# Patient Record
Sex: Male | Born: 1935 | Race: White | Hispanic: No | Marital: Married | Smoking: Never smoker
Health system: Southern US, Community
[De-identification: ages and names within clinical notes are randomized; demographics above are authoritative.]

## PROBLEM LIST (undated history)

## (undated) DIAGNOSIS — E785 Hyperlipidemia, unspecified: Secondary | ICD-10-CM

## (undated) DIAGNOSIS — Z951 Presence of aortocoronary bypass graft: Secondary | ICD-10-CM

## (undated) DIAGNOSIS — I2510 Atherosclerotic heart disease of native coronary artery without angina pectoris: Secondary | ICD-10-CM

## (undated) DIAGNOSIS — C801 Malignant (primary) neoplasm, unspecified: Secondary | ICD-10-CM

## (undated) DIAGNOSIS — Z9049 Acquired absence of other specified parts of digestive tract: Secondary | ICD-10-CM

## (undated) HISTORY — PX: CHOLECYSTECTOMY: SHX55

## (undated) HISTORY — PX: CARDIAC SURGERY: SHX584

---

## 1985-12-09 DIAGNOSIS — Z951 Presence of aortocoronary bypass graft: Secondary | ICD-10-CM

## 1985-12-09 HISTORY — DX: Presence of aortocoronary bypass graft: Z95.1

## 1997-12-09 DIAGNOSIS — Z9049 Acquired absence of other specified parts of digestive tract: Secondary | ICD-10-CM

## 1997-12-09 HISTORY — DX: Acquired absence of other specified parts of digestive tract: Z90.49

## 2010-03-26 ENCOUNTER — Encounter: Payer: Self-pay | Admitting: Gastroenterology

## 2010-04-13 ENCOUNTER — Encounter: Payer: Self-pay | Admitting: Gastroenterology

## 2010-04-17 ENCOUNTER — Encounter: Payer: Self-pay | Admitting: Gastroenterology

## 2010-04-19 ENCOUNTER — Encounter: Payer: Self-pay | Admitting: Gastroenterology

## 2010-04-23 ENCOUNTER — Encounter: Payer: Self-pay | Admitting: Gastroenterology

## 2010-05-07 ENCOUNTER — Encounter: Payer: Self-pay | Admitting: Gastroenterology

## 2010-05-11 ENCOUNTER — Encounter: Payer: Self-pay | Admitting: Gastroenterology

## 2010-05-15 ENCOUNTER — Encounter (INDEPENDENT_AMBULATORY_CARE_PROVIDER_SITE_OTHER): Payer: Self-pay | Admitting: *Deleted

## 2010-05-15 ENCOUNTER — Telehealth (INDEPENDENT_AMBULATORY_CARE_PROVIDER_SITE_OTHER): Payer: Self-pay | Admitting: *Deleted

## 2010-05-15 DIAGNOSIS — R933 Abnormal findings on diagnostic imaging of other parts of digestive tract: Secondary | ICD-10-CM

## 2010-05-18 ENCOUNTER — Telehealth (INDEPENDENT_AMBULATORY_CARE_PROVIDER_SITE_OTHER): Payer: Self-pay | Admitting: *Deleted

## 2011-01-08 NOTE — Letter (Signed)
Summary: Med list/[facility]  Med list/[facility]   Imported By: Lester [place] 05/17/2010 10:14:56  _____________________________________________________________________  External Attachment:    Type:   Image     Comment:   External Document

## 2011-01-08 NOTE — Letter (Signed)
Summary: [facility]   Imported By: Lester [place] 05/17/2010 10:20:07  _____________________________________________________________________  External Attachment:    Type:   Image     Comment:   External Document

## 2011-01-08 NOTE — Letter (Signed)
Summary: EGD Instructions  [facility]  [address] [place], [place] [address]   Phone: [phone]  Fax: [phone]       Marc Adams    [birth year]    MRN: [id number]       Procedure Day /Date:05/31/10 [name]     Arrival Time: 1030 am     Procedure Time:1130 am     Location of Procedure:                     [facility] ( Outpatient Registration)    PREPARATION FOR ENDOSCOPY   On 05/31/10 THE DAY OF THE PROCEDURE:  1.   No solid foods, milk or milk products are allowed after midnight the night before your procedure.  2.   Do not drink anything colored red or purple.  Avoid juices with pulp.  No orange juice.  3.  You may drink clear liquids until 730 am  which is 4 hours before your procedure.                                                                                                CLEAR LIQUIDS INCLUDE: Water Jello Ice Popsicles Tea (sugar ok, no milk/cream) Powdered fruit flavored drinks Coffee (sugar ok, no milk/cream) Gatorade Juice: apple, white grape, white cranberry  Lemonade Clear bullion, consomm, broth Carbonated beverages (any kind) Strained chicken noodle soup Hard Candy   MEDICATION INSTRUCTIONS  Unless otherwise instructed, you should take regular prescription medications with a small sip of water as early as possible the morning of your procedure.             OTHER INSTRUCTIONS  You will need a responsible adult at least 74 years of age to accompany you and drive you home.   This person must remain in the waiting room during your procedure.  Wear loose fitting clothing that is easily removed.  Leave jewelry and other valuables at home.  However, you may wish to bring a book to read or an iPod/MP3 player to listen to music as you wait for your procedure to start.  Remove all body piercing jewelry and leave at home.  Total time from sign-in until discharge is approximately 2-3 hours.  You should go  home directly after your procedure and rest.  You can resume normal activities the day after your procedure.  The day of your procedure you should not:   Drive   Make legal decisions   Operate machinery   Drink alcohol   Return to work  You will receive specific instructions about eating, activities and medications before you leave.    The above instructions have been reviewed and explained to me by   Chales Abrahams CMA Duncan Dull)  May 15, 2010 2:32 PM     I fully understand and can verbalize these instructions over the phone mailed to [facility] 05/15/10

## 2011-01-08 NOTE — Progress Notes (Signed)
Summary: EUS  Phone Note Outgoing Call Call back at [facility] Phone [phone]   Call placed by: Chales Abrahams CMA Duncan Dull),  May 15, 2010 2:29 PM Summary of Call: pt scheduled for EUS need to review  meds and instruct pt no answer on home phone Initial call taken by: Chales Abrahams CMA Duncan Dull),  May 15, 2010 2:30 PM  Follow-up for Phone Call        pt aware and meds were reviewed.  he will call with any questions or concerns Follow-up by: Chales Abrahams CMA Duncan Dull),  May 16, 2010 2:10 PM  New Problems: NONSPECIFIC ABN FINDING RAD & OTH EXAM GI TRACT (ICD-793.4)   New Problems: NONSPECIFIC ABN FINDING RAD & OTH EXAM GI TRACT (ICD-793.4)

## 2011-01-08 NOTE — Progress Notes (Signed)
Summary: EUS cx  Phone Note From Other Clinic   Caller: Nurse Summary of Call: [facility] is calling to cx appt for june 23 pt went to [facility],  [facility] called and appt cx. Initial call taken by: Chales Abrahams CMA Duncan Dull),  May 18, 2010 10:35 AM  Follow-up for Phone Call        ok Follow-up by: Rachael Fee MD,  May 18, 2010 1:11 PM

## 2012-11-10 ENCOUNTER — Other Ambulatory Visit (HOSPITAL_COMMUNITY): Payer: Self-pay | Admitting: Nephrology

## 2015-01-19 DIAGNOSIS — N189 Chronic kidney disease, unspecified: Secondary | ICD-10-CM | POA: Diagnosis not present

## 2015-01-19 DIAGNOSIS — D472 Monoclonal gammopathy: Secondary | ICD-10-CM | POA: Diagnosis not present

## 2015-01-19 DIAGNOSIS — C252 Malignant neoplasm of tail of pancreas: Secondary | ICD-10-CM | POA: Diagnosis not present

## 2015-02-20 DIAGNOSIS — N183 Chronic kidney disease, stage 3 (moderate): Secondary | ICD-10-CM | POA: Diagnosis not present

## 2015-02-20 DIAGNOSIS — I129 Hypertensive chronic kidney disease with stage 1 through stage 4 chronic kidney disease, or unspecified chronic kidney disease: Secondary | ICD-10-CM | POA: Diagnosis not present

## 2015-02-20 DIAGNOSIS — E785 Hyperlipidemia, unspecified: Secondary | ICD-10-CM | POA: Diagnosis not present

## 2015-02-20 DIAGNOSIS — I2510 Atherosclerotic heart disease of native coronary artery without angina pectoris: Secondary | ICD-10-CM | POA: Diagnosis not present

## 2015-06-05 DIAGNOSIS — S0001XA Abrasion of scalp, initial encounter: Secondary | ICD-10-CM | POA: Diagnosis not present

## 2015-06-19 DIAGNOSIS — J0100 Acute maxillary sinusitis, unspecified: Secondary | ICD-10-CM | POA: Diagnosis not present

## 2015-07-13 DIAGNOSIS — N200 Calculus of kidney: Secondary | ICD-10-CM | POA: Diagnosis not present

## 2015-07-13 DIAGNOSIS — K5730 Diverticulosis of large intestine without perforation or abscess without bleeding: Secondary | ICD-10-CM | POA: Diagnosis not present

## 2015-07-13 DIAGNOSIS — C252 Malignant neoplasm of tail of pancreas: Secondary | ICD-10-CM | POA: Diagnosis not present

## 2015-07-13 DIAGNOSIS — R911 Solitary pulmonary nodule: Secondary | ICD-10-CM | POA: Diagnosis not present

## 2015-07-13 DIAGNOSIS — K4090 Unilateral inguinal hernia, without obstruction or gangrene, not specified as recurrent: Secondary | ICD-10-CM | POA: Diagnosis not present

## 2015-07-13 DIAGNOSIS — Z8507 Personal history of malignant neoplasm of pancreas: Secondary | ICD-10-CM | POA: Diagnosis not present

## 2015-07-19 DIAGNOSIS — D472 Monoclonal gammopathy: Secondary | ICD-10-CM | POA: Diagnosis not present

## 2015-07-19 DIAGNOSIS — Z8507 Personal history of malignant neoplasm of pancreas: Secondary | ICD-10-CM | POA: Diagnosis not present

## 2015-08-05 DIAGNOSIS — Z951 Presence of aortocoronary bypass graft: Secondary | ICD-10-CM | POA: Diagnosis not present

## 2015-08-05 DIAGNOSIS — I252 Old myocardial infarction: Secondary | ICD-10-CM | POA: Diagnosis not present

## 2015-08-05 DIAGNOSIS — I2510 Atherosclerotic heart disease of native coronary artery without angina pectoris: Secondary | ICD-10-CM | POA: Diagnosis not present

## 2015-08-05 DIAGNOSIS — R072 Precordial pain: Secondary | ICD-10-CM | POA: Diagnosis not present

## 2015-08-05 DIAGNOSIS — Z955 Presence of coronary angioplasty implant and graft: Secondary | ICD-10-CM | POA: Diagnosis not present

## 2015-08-05 DIAGNOSIS — R079 Chest pain, unspecified: Secondary | ICD-10-CM | POA: Diagnosis not present

## 2015-08-05 DIAGNOSIS — I10 Essential (primary) hypertension: Secondary | ICD-10-CM | POA: Diagnosis not present

## 2015-08-05 DIAGNOSIS — E780 Pure hypercholesterolemia: Secondary | ICD-10-CM | POA: Diagnosis not present

## 2015-08-08 DIAGNOSIS — R079 Chest pain, unspecified: Secondary | ICD-10-CM | POA: Diagnosis not present

## 2015-08-08 DIAGNOSIS — I110 Hypertensive heart disease with heart failure: Secondary | ICD-10-CM | POA: Diagnosis not present

## 2015-08-08 DIAGNOSIS — I5032 Chronic diastolic (congestive) heart failure: Secondary | ICD-10-CM | POA: Diagnosis not present

## 2015-08-08 DIAGNOSIS — I25119 Atherosclerotic heart disease of native coronary artery with unspecified angina pectoris: Secondary | ICD-10-CM | POA: Diagnosis not present

## 2015-08-08 DIAGNOSIS — R001 Bradycardia, unspecified: Secondary | ICD-10-CM | POA: Diagnosis not present

## 2015-08-31 DIAGNOSIS — I25119 Atherosclerotic heart disease of native coronary artery with unspecified angina pectoris: Secondary | ICD-10-CM | POA: Diagnosis not present

## 2015-09-05 DIAGNOSIS — I25119 Atherosclerotic heart disease of native coronary artery with unspecified angina pectoris: Secondary | ICD-10-CM | POA: Diagnosis not present

## 2015-09-05 DIAGNOSIS — I110 Hypertensive heart disease with heart failure: Secondary | ICD-10-CM | POA: Diagnosis not present

## 2015-09-05 DIAGNOSIS — E785 Hyperlipidemia, unspecified: Secondary | ICD-10-CM | POA: Diagnosis not present

## 2015-09-05 DIAGNOSIS — I5032 Chronic diastolic (congestive) heart failure: Secondary | ICD-10-CM | POA: Diagnosis not present

## 2015-09-20 DIAGNOSIS — I10 Essential (primary) hypertension: Secondary | ICD-10-CM | POA: Diagnosis not present

## 2015-09-20 DIAGNOSIS — R5383 Other fatigue: Secondary | ICD-10-CM | POA: Diagnosis not present

## 2015-09-20 DIAGNOSIS — E559 Vitamin D deficiency, unspecified: Secondary | ICD-10-CM | POA: Diagnosis not present

## 2015-09-20 DIAGNOSIS — Z0000 Encounter for general adult medical examination without abnormal findings: Secondary | ICD-10-CM | POA: Diagnosis not present

## 2015-09-20 DIAGNOSIS — G479 Sleep disorder, unspecified: Secondary | ICD-10-CM | POA: Diagnosis not present

## 2015-09-20 DIAGNOSIS — Z125 Encounter for screening for malignant neoplasm of prostate: Secondary | ICD-10-CM | POA: Diagnosis not present

## 2015-10-04 DIAGNOSIS — Z23 Encounter for immunization: Secondary | ICD-10-CM | POA: Diagnosis not present

## 2015-10-24 DIAGNOSIS — N183 Chronic kidney disease, stage 3 (moderate): Secondary | ICD-10-CM | POA: Diagnosis not present

## 2015-10-24 DIAGNOSIS — E782 Mixed hyperlipidemia: Secondary | ICD-10-CM | POA: Diagnosis not present

## 2015-10-24 DIAGNOSIS — I2510 Atherosclerotic heart disease of native coronary artery without angina pectoris: Secondary | ICD-10-CM | POA: Diagnosis not present

## 2015-10-24 DIAGNOSIS — I10 Essential (primary) hypertension: Secondary | ICD-10-CM | POA: Diagnosis not present

## 2015-10-24 DIAGNOSIS — Z79899 Other long term (current) drug therapy: Secondary | ICD-10-CM | POA: Diagnosis not present

## 2015-10-26 DIAGNOSIS — Z1211 Encounter for screening for malignant neoplasm of colon: Secondary | ICD-10-CM | POA: Diagnosis not present

## 2015-11-22 DIAGNOSIS — H43393 Other vitreous opacities, bilateral: Secondary | ICD-10-CM | POA: Diagnosis not present

## 2015-11-22 DIAGNOSIS — H26491 Other secondary cataract, right eye: Secondary | ICD-10-CM | POA: Diagnosis not present

## 2015-11-22 DIAGNOSIS — H524 Presbyopia: Secondary | ICD-10-CM | POA: Diagnosis not present

## 2015-11-29 DIAGNOSIS — L821 Other seborrheic keratosis: Secondary | ICD-10-CM | POA: Diagnosis not present

## 2015-11-29 DIAGNOSIS — L570 Actinic keratosis: Secondary | ICD-10-CM | POA: Diagnosis not present

## 2015-11-29 DIAGNOSIS — D2372 Other benign neoplasm of skin of left lower limb, including hip: Secondary | ICD-10-CM | POA: Diagnosis not present

## 2015-12-13 DIAGNOSIS — I110 Hypertensive heart disease with heart failure: Secondary | ICD-10-CM | POA: Diagnosis not present

## 2015-12-13 DIAGNOSIS — I5032 Chronic diastolic (congestive) heart failure: Secondary | ICD-10-CM | POA: Diagnosis not present

## 2015-12-13 DIAGNOSIS — I25119 Atherosclerotic heart disease of native coronary artery with unspecified angina pectoris: Secondary | ICD-10-CM | POA: Diagnosis not present

## 2016-01-15 DIAGNOSIS — D472 Monoclonal gammopathy: Secondary | ICD-10-CM | POA: Diagnosis not present

## 2016-01-15 DIAGNOSIS — C252 Malignant neoplasm of tail of pancreas: Secondary | ICD-10-CM | POA: Diagnosis not present

## 2016-01-19 DIAGNOSIS — D473 Essential (hemorrhagic) thrombocythemia: Secondary | ICD-10-CM

## 2016-01-19 DIAGNOSIS — Z8507 Personal history of malignant neoplasm of pancreas: Secondary | ICD-10-CM | POA: Diagnosis not present

## 2016-01-19 DIAGNOSIS — D72829 Elevated white blood cell count, unspecified: Secondary | ICD-10-CM

## 2016-01-19 DIAGNOSIS — D472 Monoclonal gammopathy: Secondary | ICD-10-CM | POA: Diagnosis not present

## 2016-01-19 DIAGNOSIS — N189 Chronic kidney disease, unspecified: Secondary | ICD-10-CM | POA: Diagnosis not present

## 2016-01-19 DIAGNOSIS — R911 Solitary pulmonary nodule: Secondary | ICD-10-CM | POA: Diagnosis not present

## 2016-02-12 DIAGNOSIS — I2510 Atherosclerotic heart disease of native coronary artery without angina pectoris: Secondary | ICD-10-CM | POA: Diagnosis not present

## 2016-02-12 DIAGNOSIS — E1165 Type 2 diabetes mellitus with hyperglycemia: Secondary | ICD-10-CM | POA: Diagnosis not present

## 2016-02-12 DIAGNOSIS — C259 Malignant neoplasm of pancreas, unspecified: Secondary | ICD-10-CM | POA: Diagnosis not present

## 2016-02-12 DIAGNOSIS — E782 Mixed hyperlipidemia: Secondary | ICD-10-CM | POA: Diagnosis not present

## 2016-02-12 DIAGNOSIS — N183 Chronic kidney disease, stage 3 (moderate): Secondary | ICD-10-CM | POA: Diagnosis not present

## 2016-03-21 DIAGNOSIS — N183 Chronic kidney disease, stage 3 (moderate): Secondary | ICD-10-CM | POA: Diagnosis not present

## 2016-03-21 DIAGNOSIS — E119 Type 2 diabetes mellitus without complications: Secondary | ICD-10-CM | POA: Diagnosis not present

## 2016-03-21 DIAGNOSIS — I129 Hypertensive chronic kidney disease with stage 1 through stage 4 chronic kidney disease, or unspecified chronic kidney disease: Secondary | ICD-10-CM | POA: Diagnosis not present

## 2016-03-21 DIAGNOSIS — E785 Hyperlipidemia, unspecified: Secondary | ICD-10-CM | POA: Diagnosis not present

## 2016-03-21 DIAGNOSIS — I2510 Atherosclerotic heart disease of native coronary artery without angina pectoris: Secondary | ICD-10-CM | POA: Diagnosis not present

## 2016-04-24 DIAGNOSIS — N183 Chronic kidney disease, stage 3 (moderate): Secondary | ICD-10-CM | POA: Diagnosis not present

## 2016-05-16 DIAGNOSIS — L570 Actinic keratosis: Secondary | ICD-10-CM | POA: Diagnosis not present

## 2016-05-30 DIAGNOSIS — N183 Chronic kidney disease, stage 3 (moderate): Secondary | ICD-10-CM | POA: Diagnosis not present

## 2016-05-30 DIAGNOSIS — E1165 Type 2 diabetes mellitus with hyperglycemia: Secondary | ICD-10-CM | POA: Diagnosis not present

## 2016-05-30 DIAGNOSIS — I10 Essential (primary) hypertension: Secondary | ICD-10-CM | POA: Diagnosis not present

## 2016-05-30 DIAGNOSIS — Z125 Encounter for screening for malignant neoplasm of prostate: Secondary | ICD-10-CM | POA: Diagnosis not present

## 2016-07-04 DIAGNOSIS — I110 Hypertensive heart disease with heart failure: Secondary | ICD-10-CM | POA: Diagnosis not present

## 2016-07-04 DIAGNOSIS — E785 Hyperlipidemia, unspecified: Secondary | ICD-10-CM | POA: Diagnosis not present

## 2016-07-04 DIAGNOSIS — I5032 Chronic diastolic (congestive) heart failure: Secondary | ICD-10-CM | POA: Diagnosis not present

## 2016-07-04 DIAGNOSIS — I2510 Atherosclerotic heart disease of native coronary artery without angina pectoris: Secondary | ICD-10-CM | POA: Diagnosis not present

## 2016-07-04 DIAGNOSIS — I25119 Atherosclerotic heart disease of native coronary artery with unspecified angina pectoris: Secondary | ICD-10-CM | POA: Diagnosis not present

## 2016-07-16 DIAGNOSIS — I712 Thoracic aortic aneurysm, without rupture: Secondary | ICD-10-CM | POA: Diagnosis not present

## 2016-07-16 DIAGNOSIS — Z8507 Personal history of malignant neoplasm of pancreas: Secondary | ICD-10-CM | POA: Diagnosis not present

## 2016-07-16 DIAGNOSIS — Z9049 Acquired absence of other specified parts of digestive tract: Secondary | ICD-10-CM | POA: Diagnosis not present

## 2016-07-16 DIAGNOSIS — I700 Atherosclerosis of aorta: Secondary | ICD-10-CM | POA: Diagnosis not present

## 2016-07-16 DIAGNOSIS — N200 Calculus of kidney: Secondary | ICD-10-CM | POA: Diagnosis not present

## 2016-07-16 DIAGNOSIS — C259 Malignant neoplasm of pancreas, unspecified: Secondary | ICD-10-CM | POA: Diagnosis not present

## 2016-07-18 DIAGNOSIS — D472 Monoclonal gammopathy: Secondary | ICD-10-CM | POA: Diagnosis not present

## 2016-07-18 DIAGNOSIS — Z8507 Personal history of malignant neoplasm of pancreas: Secondary | ICD-10-CM | POA: Diagnosis not present

## 2016-07-18 DIAGNOSIS — N189 Chronic kidney disease, unspecified: Secondary | ICD-10-CM | POA: Diagnosis not present

## 2016-08-22 ENCOUNTER — Emergency Department (HOSPITAL_COMMUNITY): Payer: Medicare Other

## 2016-08-22 ENCOUNTER — Encounter (HOSPITAL_COMMUNITY)
Admission: EM | Disposition: A | Discharge status: Home / Self Care | Payer: Self-pay | Source: Home / Self Care | Attending: Family Medicine

## 2016-08-22 ENCOUNTER — Encounter (HOSPITAL_COMMUNITY): Payer: Self-pay

## 2016-08-22 ENCOUNTER — Inpatient Hospital Stay (HOSPITAL_COMMUNITY)
Admission: EM | Admit: 2016-08-22 | Discharge: 2016-08-23 | DRG: 378 | Disposition: A | Discharge status: Home / Self Care | Payer: Medicare Other | Attending: Family Medicine | Admitting: Family Medicine

## 2016-08-22 DIAGNOSIS — D72829 Elevated white blood cell count, unspecified: Secondary | ICD-10-CM | POA: Diagnosis not present

## 2016-08-22 DIAGNOSIS — I130 Hypertensive heart and chronic kidney disease with heart failure and stage 1 through stage 4 chronic kidney disease, or unspecified chronic kidney disease: Secondary | ICD-10-CM | POA: Diagnosis present

## 2016-08-22 DIAGNOSIS — Z7982 Long term (current) use of aspirin: Secondary | ICD-10-CM | POA: Diagnosis not present

## 2016-08-22 DIAGNOSIS — K317 Polyp of stomach and duodenum: Secondary | ICD-10-CM | POA: Diagnosis not present

## 2016-08-22 DIAGNOSIS — K2940 Chronic atrophic gastritis without bleeding: Secondary | ICD-10-CM | POA: Diagnosis not present

## 2016-08-22 DIAGNOSIS — I5032 Chronic diastolic (congestive) heart failure: Secondary | ICD-10-CM | POA: Diagnosis present

## 2016-08-22 DIAGNOSIS — M109 Gout, unspecified: Secondary | ICD-10-CM | POA: Diagnosis present

## 2016-08-22 DIAGNOSIS — Z7902 Long term (current) use of antithrombotics/antiplatelets: Secondary | ICD-10-CM | POA: Diagnosis not present

## 2016-08-22 DIAGNOSIS — C169 Malignant neoplasm of stomach, unspecified: Secondary | ICD-10-CM | POA: Diagnosis not present

## 2016-08-22 DIAGNOSIS — Z8507 Personal history of malignant neoplasm of pancreas: Secondary | ICD-10-CM | POA: Diagnosis not present

## 2016-08-22 DIAGNOSIS — D729 Disorder of white blood cells, unspecified: Secondary | ICD-10-CM | POA: Diagnosis not present

## 2016-08-22 DIAGNOSIS — I77811 Abdominal aortic ectasia: Secondary | ICD-10-CM | POA: Diagnosis present

## 2016-08-22 DIAGNOSIS — D490 Neoplasm of unspecified behavior of digestive system: Secondary | ICD-10-CM | POA: Diagnosis not present

## 2016-08-22 DIAGNOSIS — K922 Gastrointestinal hemorrhage, unspecified: Principal | ICD-10-CM | POA: Diagnosis present

## 2016-08-22 DIAGNOSIS — R9431 Abnormal electrocardiogram [ECG] [EKG]: Secondary | ICD-10-CM | POA: Diagnosis not present

## 2016-08-22 DIAGNOSIS — Z86010 Personal history of colonic polyps: Secondary | ICD-10-CM

## 2016-08-22 DIAGNOSIS — E1122 Type 2 diabetes mellitus with diabetic chronic kidney disease: Secondary | ICD-10-CM | POA: Diagnosis not present

## 2016-08-22 DIAGNOSIS — I2510 Atherosclerotic heart disease of native coronary artery without angina pectoris: Secondary | ICD-10-CM | POA: Diagnosis present

## 2016-08-22 DIAGNOSIS — D500 Iron deficiency anemia secondary to blood loss (chronic): Secondary | ICD-10-CM | POA: Diagnosis not present

## 2016-08-22 DIAGNOSIS — R262 Difficulty in walking, not elsewhere classified: Secondary | ICD-10-CM

## 2016-08-22 DIAGNOSIS — C801 Malignant (primary) neoplasm, unspecified: Secondary | ICD-10-CM | POA: Diagnosis not present

## 2016-08-22 DIAGNOSIS — K920 Hematemesis: Secondary | ICD-10-CM

## 2016-08-22 DIAGNOSIS — E785 Hyperlipidemia, unspecified: Secondary | ICD-10-CM | POA: Diagnosis present

## 2016-08-22 DIAGNOSIS — Z79899 Other long term (current) drug therapy: Secondary | ICD-10-CM

## 2016-08-22 DIAGNOSIS — N183 Chronic kidney disease, stage 3 (moderate): Secondary | ICD-10-CM | POA: Diagnosis present

## 2016-08-22 DIAGNOSIS — D62 Acute posthemorrhagic anemia: Secondary | ICD-10-CM | POA: Diagnosis present

## 2016-08-22 DIAGNOSIS — Z888 Allergy status to other drugs, medicaments and biological substances status: Secondary | ICD-10-CM

## 2016-08-22 DIAGNOSIS — K2970 Gastritis, unspecified, without bleeding: Secondary | ICD-10-CM | POA: Diagnosis not present

## 2016-08-22 DIAGNOSIS — R1013 Epigastric pain: Secondary | ICD-10-CM | POA: Diagnosis not present

## 2016-08-22 DIAGNOSIS — R110 Nausea: Secondary | ICD-10-CM | POA: Diagnosis not present

## 2016-08-22 DIAGNOSIS — K449 Diaphragmatic hernia without obstruction or gangrene: Secondary | ICD-10-CM | POA: Diagnosis not present

## 2016-08-22 HISTORY — DX: Atherosclerotic heart disease of native coronary artery without angina pectoris: I25.10

## 2016-08-22 HISTORY — PX: ESOPHAGOGASTRODUODENOSCOPY: SHX5428

## 2016-08-22 HISTORY — DX: Hyperlipidemia, unspecified: E78.5

## 2016-08-22 HISTORY — DX: Presence of aortocoronary bypass graft: Z95.1

## 2016-08-22 HISTORY — DX: Acquired absence of other specified parts of digestive tract: Z90.49

## 2016-08-22 HISTORY — DX: Malignant (primary) neoplasm, unspecified: C80.1

## 2016-08-22 LAB — CBC WITH DIFFERENTIAL/PLATELET
Basophils Absolute: 0 10*3/uL (ref 0.0–0.1)
Basophils Relative: 0 %
Eosinophils Absolute: 0.2 10*3/uL (ref 0.0–0.7)
Eosinophils Relative: 1 %
HEMATOCRIT: 33.4 % — AB (ref 39.0–52.0)
HEMOGLOBIN: 10.9 g/dL — AB (ref 13.0–17.0)
LYMPHS ABS: 3.8 10*3/uL (ref 0.7–4.0)
Lymphocytes Relative: 28 %
MCH: 29.5 pg (ref 26.0–34.0)
MCHC: 32.6 g/dL (ref 30.0–36.0)
MCV: 90.3 fL (ref 78.0–100.0)
MONOS PCT: 10 %
Monocytes Absolute: 1.4 10*3/uL — ABNORMAL HIGH (ref 0.1–1.0)
NEUTROS ABS: 8.4 10*3/uL — AB (ref 1.7–7.7)
NEUTROS PCT: 61 %
Platelets: 406 10*3/uL — ABNORMAL HIGH (ref 150–400)
RBC: 3.7 MIL/uL — ABNORMAL LOW (ref 4.22–5.81)
RDW: 14.7 % (ref 11.5–15.5)
WBC: 13.8 10*3/uL — ABNORMAL HIGH (ref 4.0–10.5)

## 2016-08-22 LAB — COMPREHENSIVE METABOLIC PANEL
ALK PHOS: 53 U/L (ref 38–126)
ALT: 15 U/L — ABNORMAL LOW (ref 17–63)
ANION GAP: 7 (ref 5–15)
AST: 22 U/L (ref 15–41)
Albumin: 3.4 g/dL — ABNORMAL LOW (ref 3.5–5.0)
BILIRUBIN TOTAL: 1.1 mg/dL (ref 0.3–1.2)
BUN: 33 mg/dL — ABNORMAL HIGH (ref 6–20)
CALCIUM: 9.1 mg/dL (ref 8.9–10.3)
CO2: 23 mmol/L (ref 22–32)
Chloride: 106 mmol/L (ref 101–111)
Creatinine, Ser: 1.51 mg/dL — ABNORMAL HIGH (ref 0.61–1.24)
GFR, EST AFRICAN AMERICAN: 49 mL/min — AB (ref >60)
GFR, EST NON AFRICAN AMERICAN: 42 mL/min — AB (ref >60)
GLUCOSE: 135 mg/dL — AB (ref 65–99)
Potassium: 4.3 mmol/L (ref 3.5–5.1)
Sodium: 136 mmol/L (ref 135–145)
TOTAL PROTEIN: 6.2 g/dL — AB (ref 6.5–8.1)

## 2016-08-22 LAB — I-STAT CHEM 8, ED
BUN: 33 mg/dL — ABNORMAL HIGH (ref 6–20)
CREATININE: 1.6 mg/dL — AB (ref 0.61–1.24)
Calcium, Ion: 1.09 mmol/L — ABNORMAL LOW (ref 1.15–1.40)
Chloride: 104 mmol/L (ref 101–111)
GLUCOSE: 130 mg/dL — AB (ref 65–99)
HCT: 34 % — ABNORMAL LOW (ref 39.0–52.0)
HEMOGLOBIN: 11.6 g/dL — AB (ref 13.0–17.0)
POTASSIUM: 4.3 mmol/L (ref 3.5–5.1)
Sodium: 136 mmol/L (ref 135–145)
TCO2: 22 mmol/L (ref 0–100)

## 2016-08-22 LAB — ABO/RH: ABO/RH(D): O POS

## 2016-08-22 LAB — TYPE AND SCREEN
ABO/RH(D): O POS
ANTIBODY SCREEN: NEGATIVE

## 2016-08-22 LAB — LIPASE, BLOOD: Lipase: 28 U/L (ref 11–51)

## 2016-08-22 SURGERY — EGD (ESOPHAGOGASTRODUODENOSCOPY)
Anesthesia: Moderate Sedation

## 2016-08-22 MED ORDER — BUTAMBEN-TETRACAINE-BENZOCAINE 2-2-14 % EX AERO
INHALATION_SPRAY | CUTANEOUS | Status: DC | PRN
  Administered 2016-08-22: 1 via TOPICAL

## 2016-08-22 MED ORDER — FENTANYL CITRATE (PF) 100 MCG/2ML IJ SOLN
INTRAMUSCULAR | Status: DC | PRN
  Administered 2016-08-22: 12.5 ug via INTRAVENOUS
  Administered 2016-08-22: 25 ug via INTRAVENOUS

## 2016-08-22 MED ORDER — IOPAMIDOL (ISOVUE-300) INJECTION 61%
INTRAVENOUS | Status: AC
  Filled 2016-08-22: qty 75

## 2016-08-22 MED ORDER — ACETAMINOPHEN 650 MG RE SUPP: 650.0000 mg | Freq: Four times a day (QID) | RECTAL | Status: DC | PRN

## 2016-08-22 MED ORDER — MIDAZOLAM HCL 5 MG/ML IJ SOLN
INTRAMUSCULAR | Status: AC
  Filled 2016-08-22: qty 2

## 2016-08-22 MED ORDER — SODIUM CHLORIDE 0.9 % IV SOLN
8.0000 mg/h | INTRAVENOUS | Status: DC
  Administered 2016-08-22: 8 mg/h via INTRAVENOUS
  Filled 2016-08-22 (×2): qty 80

## 2016-08-22 MED ORDER — INSULIN ASPART 100 UNIT/ML ~~LOC~~ SOLN: 0.0000 [IU] | Freq: Three times a day (TID) | SUBCUTANEOUS | Status: DC

## 2016-08-22 MED ORDER — MIDAZOLAM HCL 10 MG/2ML IJ SOLN
INTRAMUSCULAR | Status: DC | PRN
  Administered 2016-08-22: 1 mg via INTRAVENOUS
  Administered 2016-08-22: 2 mg via INTRAVENOUS
  Administered 2016-08-22: 1 mg via INTRAVENOUS

## 2016-08-22 MED ORDER — PANTOPRAZOLE SODIUM 40 MG PO TBEC
40.0000 mg | DELAYED_RELEASE_TABLET | Freq: Every day | ORAL | Status: DC
  Administered 2016-08-23: 40 mg via ORAL
  Filled 2016-08-22: qty 1

## 2016-08-22 MED ORDER — SODIUM CHLORIDE 0.9 % IV SOLN
INTRAVENOUS | Status: DC
  Administered 2016-08-22: 20:00:00 via INTRAVENOUS

## 2016-08-22 MED ORDER — FENTANYL CITRATE (PF) 100 MCG/2ML IJ SOLN
INTRAMUSCULAR | Status: AC
  Filled 2016-08-22: qty 2

## 2016-08-22 MED ORDER — SODIUM CHLORIDE 0.9% FLUSH: 3.0000 mL | Freq: Two times a day (BID) | INTRAVENOUS | Status: DC

## 2016-08-22 MED ORDER — SODIUM CHLORIDE 0.9 % IV BOLUS (SEPSIS)
1000.0000 mL | Freq: Once | INTRAVENOUS | Status: AC
  Administered 2016-08-22: 1000 mL via INTRAVENOUS

## 2016-08-22 MED ORDER — SODIUM CHLORIDE 0.9 % IV SOLN
80.0000 mg | Freq: Once | INTRAVENOUS | Status: AC
  Administered 2016-08-22: 80 mg via INTRAVENOUS
  Filled 2016-08-22 (×2): qty 80

## 2016-08-22 MED ORDER — SODIUM CHLORIDE 0.9 % IV SOLN
INTRAVENOUS | Status: DC
  Administered 2016-08-22 (×2): via INTRAVENOUS

## 2016-08-22 MED ORDER — IOPAMIDOL (ISOVUE-300) INJECTION 61%
75.0000 mL | Freq: Once | INTRAVENOUS | Status: AC | PRN
  Administered 2016-08-22: 75 mL via INTRAVENOUS

## 2016-08-22 MED ORDER — ACETAMINOPHEN 325 MG PO TABS: 650.0000 mg | ORAL_TABLET | Freq: Four times a day (QID) | ORAL | Status: DC | PRN

## 2016-08-22 MED ORDER — ONDANSETRON HCL 4 MG/2ML IJ SOLN
4.0000 mg | Freq: Once | INTRAMUSCULAR | Status: AC
  Administered 2016-08-22: 4 mg via INTRAVENOUS
  Filled 2016-08-22: qty 2

## 2016-08-22 MED ORDER — PANTOPRAZOLE SODIUM 40 MG IV SOLR
40.0000 mg | Freq: Two times a day (BID) | INTRAVENOUS | Status: DC
Start: 2016-08-26 — End: 2016-08-22

## 2016-08-22 NOTE — ED Provider Notes (Signed)
[facility] DEPT Provider Note   CSN: [id number] Arrival date & time: 08/22/16  1007     History   Chief Complaint Chief Complaint  Patient presents with  . Hematemesis    HPI Marc Adams is a 81 y.o. male.  81 yo M with a chief complaint of vomiting blood. The started this morning. Patient had a large amount of blood in the bowl. Initially started with some right sided abdominal pain just prior to vomiting. This is completely resolved. He had one more episode of emesis en route with EMS. Patient currently asymptomatic. Denies fevers or chills. Has a appointment in the next month for a upper and lower endoscopy for weight loss since January.   The history is provided by the patient.  Abdominal Pain   This is a new problem. The current episode started 1 to 2 hours ago. The problem occurs constantly. The problem has been resolved. The pain is associated with eating. The pain is located in the RLQ. The pain is at a severity of 8/10. The pain is severe. Associated symptoms include nausea and vomiting (bloody emesis). Pertinent negatives include fever, diarrhea, headaches, arthralgias and myalgias. Nothing aggravates the symptoms. The symptoms are relieved by vomiting.    Past Medical History:  Diagnosis Date  . Cancer ([place])   . Coronary artery disease   . Hx of CABG 1987  . Hx of cholecystectomy 1999  . Hyperlipidemia     Patient Active Problem List   Diagnosis Date Noted  . Upper GI bleed 08/22/2016  . NONSPECIFIC ABN FINDING RAD & OTH EXAM GI TRACT 05/15/2010    Past Surgical History:  Procedure Laterality Date  . CARDIAC SURGERY    . CHOLECYSTECTOMY    . ESOPHAGOGASTRODUODENOSCOPY N/A 08/22/2016   Procedure: ESOPHAGOGASTRODUODENOSCOPY (EGD);  Surgeon: Clarene Essex, MD;  Location: [facility] ENDOSCOPY;  Service: Endoscopy;  Laterality: N/A;       Home Medications    Prior to Admission medications   Medication Sig Start Date End Date Taking? Authorizing Provider    amLODipine (NORVASC) 5 MG tablet Take 5 mg by mouth daily. 07/19/16  Yes Historical Provider, MD  aspirin (GOODSENSE ASPIRIN) 325 MG tablet Take 325 mg by mouth every evening.    Yes Historical Provider, MD  cloNIDine (CATAPRES) 0.1 MG tablet Take 0.1 mg by mouth every evening.  08/19/16  Yes Historical Provider, MD  clopidogrel (PLAVIX) 75 MG tablet Take 75 mg by mouth daily.  07/15/16  Yes Historical Provider, MD  colchicine-probenecid 0.5-500 MG tablet Take 1 tablet by mouth daily.   Yes Historical Provider, MD  Cyanocobalamin (B-12) 1000 MCG/ML KIT Inject 1 Syringe as directed every 30 (thirty) days.   Yes Historical Provider, MD  diphenhydramine-acetaminophen (TYLENOL PM) 25-500 MG TABS tablet Take 1 tablet by mouth at bedtime.   Yes Historical Provider, MD  furosemide (LASIX) 40 MG tablet Take 40 mg by mouth 2 (two) times daily. 07/26/16  Yes Historical Provider, MD  glucosamine-chondroitin 500-400 MG tablet Take 2 tablets by mouth daily.   Yes Historical Provider, MD  lisinopril (PRINIVIL,ZESTRIL) 20 MG tablet Take 10 mg by mouth daily. 07/22/16  Yes Historical Provider, MD  metoprolol succinate (TOPROL-XL) 50 MG 24 hr tablet Take 50 mg by mouth daily. 02/29/16  Yes Historical Provider, MD  Multiple Vitamins-Minerals (MENS ONE DAILY PO) Take 1 tablet by mouth daily.   Yes Historical Provider, MD  Omega-3 Fatty Acids (FISH OIL) 1200 MG CAPS Take 1,200 mg by mouth 2 (  two) times daily.   Yes Historical Provider, MD  ranolazine (RANEXA) 500 MG 12 hr tablet Take 500 mg by mouth 2 (two) times daily.   Yes Historical Provider, MD  rosuvastatin (CRESTOR) 20 MG tablet Take 20 mg by mouth every evening.  07/19/16  Yes Historical Provider, MD  sitaGLIPtin (JANUVIA) 100 MG tablet Take 100 mg by mouth daily.   Yes Historical Provider, MD  vitamin C (ASCORBIC ACID) 500 MG tablet Take 500 mg by mouth daily.   Yes Historical Provider, MD    Family History History reviewed. No pertinent family history.  Social  History Social History  Substance Use Topics  . Smoking status: Never Smoker  . Smokeless tobacco: Never Used  . Alcohol use Not on file     Allergies   Phenergan [promethazine hcl]   Review of Systems Review of Systems  Constitutional: Negative for chills and fever.  HENT: Negative for congestion and facial swelling.   Eyes: Negative for discharge and visual disturbance.  Respiratory: Negative for shortness of breath.   Cardiovascular: Negative for chest pain and palpitations.  Gastrointestinal: Positive for nausea and vomiting (bloody emesis). Negative for abdominal pain and diarrhea.  Musculoskeletal: Negative for arthralgias and myalgias.  Skin: Negative for color change and rash.  Neurological: Negative for tremors, syncope and headaches.  Psychiatric/Behavioral: Negative for confusion and dysphoric mood.     Physical Exam Updated Vital Signs BP 104/80   Pulse (!) 58   Temp 97.4 F (36.3 C) (Oral)   Resp 21   Ht 5' 10"  (1.778 m)   Wt 157 lb (71.2 kg)   SpO2 100%   BMI 22.53 kg/m   Physical Exam  Constitutional: He is oriented to person, place, and time. He appears well-developed and well-nourished.  HENT:  Head: Normocephalic and atraumatic.  Eyes: Conjunctivae and EOM are normal. Pupils are equal, round, and reactive to light.  Neck: Normal range of motion. No JVD present.  Cardiovascular: Normal rate and regular rhythm.   Pulmonary/Chest: Effort normal. No stridor. No respiratory distress.  Abdominal: He exhibits no distension. There is tenderness (mild epigastric, worst to the RLQ). There is no guarding.  Musculoskeletal: Normal range of motion. He exhibits no edema.  Neurological: He is alert and oriented to person, place, and time.  Skin: Skin is warm and dry.  Psychiatric: He has a normal mood and affect. His behavior is normal.     ED Treatments / Results  Labs (all labs ordered are listed, but only abnormal results are displayed) Labs Reviewed    CBC WITH DIFFERENTIAL/PLATELET - Abnormal; Notable for the following:       Result Value   WBC 13.8 (*)    RBC 3.70 (*)    Hemoglobin 10.9 (*)    HCT 33.4 (*)    Platelets 406 (*)    Neutro Abs 8.4 (*)    Monocytes Absolute 1.4 (*)    All other components within normal limits  COMPREHENSIVE METABOLIC PANEL - Abnormal; Notable for the following:    Glucose, Bld 135 (*)    BUN 33 (*)    Creatinine, Ser 1.51 (*)    Total Protein 6.2 (*)    Albumin 3.4 (*)    ALT 15 (*)    GFR calc non Af Amer 42 (*)    GFR calc Af Amer 49 (*)    All other components within normal limits  I-STAT CHEM 8, ED - Abnormal; Notable for the following:    BUN  33 (*)    Creatinine, Ser 1.60 (*)    Glucose, Bld 130 (*)    Calcium, Ion 1.09 (*)    Hemoglobin 11.6 (*)    HCT 34.0 (*)    All other components within normal limits  LIPASE, BLOOD  TYPE AND SCREEN  ABO/RH  SURGICAL PATHOLOGY    EKG  EKG Interpretation  Date/Time:  Thursday August 22 2016 10:14:01 EDT Ventricular Rate:  53 PR Interval:    QRS Duration: 131 QT Interval:  488 QTC Calculation: 459 R Axis:   31 Text Interpretation:  Sinus rhythm Borderline prolonged PR interval Nonspecific intraventricular conduction delay Anterior infarct, old No old tracing to compare Confirmed by Amal Renbarger MD, DANIEL [id number]) on 08/22/2016 10:55:00 AM       Radiology Ct Abdomen Pelvis W Contrast  Result Date: 08/22/2016 CLINICAL DATA:  Sudden onset Mid to epigastric pain. Patient had a syncopal episode this morning and vomited blood. EXAM: CT ABDOMEN AND PELVIS WITH CONTRAST TECHNIQUE: Multidetector CT imaging of the abdomen and pelvis was performed using the standard protocol following bolus administration of intravenous contrast. CONTRAST:  43m ISOVUE-300 IOPAMIDOL (ISOVUE-300) INJECTION 61% COMPARISON:  None. FINDINGS: Lower chest: Clear lung bases. Heart normal size. Dense coronary artery calcifications. Hepatobiliary: No liver mass or focal lesion.  Gallbladder surgically absent. There is mild chronic intra and extrahepatic bile duct prominence with normal common bile duct distal tapering. Pancreas: Absent pancreatic tail consistent with previous resection. Otherwise unremarkable. Spleen: Status post spleen ectomy. Adrenals/Urinary Tract: Mild renal cortical thinning. Small bilateral nonobstructing intrarenal stones. No masses. No hydronephrosis. Normal ureters. Bladder is unremarkable. Stomach/Bowel: Stomach is not well distended. No convincing mass, ulceration or inflammation. Small bowel is unremarkable. There several left colon diverticula. No diverticulitis. Colon otherwise unremarkable. Normal appendix visualized. Vascular/Lymphatic: Abdominal aorta is distended to 2.8 cm in its infrarenal portion. There is atherosclerotic calcification along the abdominal aorta its branch vessels. Prominent nodes are noted along the periceliac and gastrohepatic ligament chains, largest measuring 1 cm short axis. These are most likely reactive. Reproductive: Prostate mildly bulges against the posterior inferior bladder base. It is mildly enlarged. Other: No abdominal wall hernia or abnormality. No abdominopelvic ascites. Musculoskeletal: Mild depression of the upper endplate of L2 with associated prominent Schmorl's node. This appears chronic. No other fractures. Mild disc degenerative change at L5-S1. No osteoblastic or osteolytic lesions. IMPRESSION: 1. No acute findings within the abdomen or pelvis. 2. Normal appendix visualized. 3. Status post cholecystectomy. 4. Small nonobstructing intrarenal stones. 5. Ectatic abdominal aorta at risk for aneurysm development. Recommend followup by ultrasound in 5 years. This recommendation follows ACR consensus guidelines: White Paper of the ACR Incidental Findings Committee II on Vascular Findings. J Am Coll Radiol 2013; 10:789-794. 6. Aortic atherosclerosis. 7. Prominent upper abdominal lymph nodes as described most likely  reactive. Electronically Signed   By: DLajean ManesM.D.   On: 08/22/2016 12:06    Procedures Procedures (including critical care time)  Medications Ordered in ED Medications  iopamidol (ISOVUE-300) 61 % injection (not administered)  pantoprazole (PROTONIX) injection 40 mg ( Intravenous MAR Unhold 08/22/16 1602)  0.9 %  sodium chloride infusion ( Intravenous Stopped 08/22/16 1702)  0.9 %  sodium chloride infusion (not administered)  ondansetron (ZOFRAN) injection 4 mg (4 mg Intravenous Given 08/22/16 1030)  sodium chloride 0.9 % bolus 1,000 mL (0 mLs Intravenous Stopped 08/22/16 1221)  iopamidol (ISOVUE-300) 61 % injection 75 mL (75 mLs Intravenous Contrast Given 08/22/16 1141)  pantoprazole (PROTONIX) 80 mg  in sodium chloride 0.9 % 100 mL IVPB (0 mg Intravenous Stopped 08/22/16 1444)     Initial Impression / Assessment and Plan / ED Course  I have reviewed the triage vital signs and the nursing notes.  Pertinent labs & imaging results that were available during my care of the patient were reviewed by me and considered in my medical decision making (see chart for details).  Clinical Course    81 yo M with a cc of hematemesis. Started this morning, vomiting a large amount of blood at home and with EMS.  No asymptomatic.  Some RLQ tenderness on exam, will CT.   CT scan was negative for acute pathology. Hemoglobin was 10 I did nothing to compare this to. BUN was 33 and unsure if this is related to the patient's CKD.  No further episodes of emesis while in the ED. Vitals remained stable. We'll discuss with GI, hospitalist for admission.  GI scoped patient in ED.  Admit.  CRITICAL CARE Performed by: Cecilio Asper   Total critical care time: 35 minutes  Critical care time was exclusive of separately billable procedures and treating other patients.  Critical care was necessary to treat or prevent imminent or life-threatening deterioration.  Critical care was time spent personally  by me on the following activities: development of treatment plan with patient and/or surrogate as well as nursing, discussions with consultants, evaluation of patient's response to treatment, examination of patient, obtaining history from patient or surrogate, ordering and performing treatments and interventions, ordering and review of laboratory studies, ordering and review of radiographic studies, pulse oximetry and re-evaluation of patient's condition.   Final Clinical Impressions(s) / ED Diagnoses   Final diagnoses:  Hematemesis with nausea    New Prescriptions New Prescriptions   No medications on file     Deno Etienne, DO 08/22/16 1815

## 2016-08-22 NOTE — ED Triage Notes (Signed)
Pt brought in by EMS due to vomiting blood this am. Per EMS pt had ABD pain this morning with some associated nausea. Per pt family pt did pass out after vomiting blood. Per EMS there was approximately 264ml of bloody emesis. Pt is a&ox4.

## 2016-08-22 NOTE — Consult Note (Signed)
Reason for Consult: Upper GI bleed Referring Physician: ER physician  Marc Adams is an 81 y.o. male.  HPI: Patient seen and examined and case discussed with multiple family members and his hospital computer chart was reviewed including some notes from [facility] and he has not recently had any GI problems and no black stools but has been on aspirin and Plavix and stopped his omeprazole about a year ago and did throw up blood a few times earlier today but does feel much better now and does have a history of polyps and has lost about 10 pounds recently and is scheduled for a colonoscopy later this month and has no other complaints  Past Medical History:  Diagnosis Date  . Cancer ([place])   . Coronary artery disease   . Hx of CABG 1987  . Hx of cholecystectomy 1999  . Hyperlipidemia     Past Surgical History:  Procedure Laterality Date  . CARDIAC SURGERY    . CHOLECYSTECTOMY      History reviewed. No pertinent family history.  Social History:  reports that he has never smoked. He has never used smokeless tobacco. He reports that he does not use drugs. His alcohol history is not on file.  Allergies:  Allergies  Allergen Reactions  . Phenergan [Promethazine Hcl] Other (See Comments)    Causes cardiac arrest    Medications: I have reviewed the patient's current medications.  Results for orders placed or performed during the hospital encounter of 08/22/16 (from the past 48 hour(s))  CBC with Differential     Status: Abnormal   Collection Time: 08/22/16 10:25 AM  Result Value Ref Range   WBC 13.8 (H) 4.0 - 10.5 K/uL   RBC 3.70 (L) 4.22 - 5.81 MIL/uL   Hemoglobin 10.9 (L) 13.0 - 17.0 g/dL   HCT 33.4 (L) 39.0 - 52.0 %   MCV 90.3 78.0 - 100.0 fL   MCH 29.5 26.0 - 34.0 pg   MCHC 32.6 30.0 - 36.0 g/dL   RDW 14.7 11.5 - 15.5 %   Platelets 406 (H) 150 - 400 K/uL   Neutrophils Relative % 61 %   Neutro Abs 8.4 (H) 1.7 - 7.7 K/uL   Lymphocytes Relative 28 %   Lymphs Abs 3.8 0.7 -  4.0 K/uL   Monocytes Relative 10 %   Monocytes Absolute 1.4 (H) 0.1 - 1.0 K/uL   Eosinophils Relative 1 %   Eosinophils Absolute 0.2 0.0 - 0.7 K/uL   Basophils Relative 0 %   Basophils Absolute 0.0 0.0 - 0.1 K/uL  Comprehensive metabolic panel     Status: Abnormal   Collection Time: 08/22/16 10:25 AM  Result Value Ref Range   Sodium 136 135 - 145 mmol/L   Potassium 4.3 3.5 - 5.1 mmol/L   Chloride 106 101 - 111 mmol/L   CO2 23 22 - 32 mmol/L   Glucose, Bld 135 (H) 65 - 99 mg/dL   BUN 33 (H) 6 - 20 mg/dL   Creatinine, Ser 1.51 (H) 0.61 - 1.24 mg/dL   Calcium 9.1 8.9 - 10.3 mg/dL   Total Protein 6.2 (L) 6.5 - 8.1 g/dL   Albumin 3.4 (L) 3.5 - 5.0 g/dL   AST 22 15 - 41 U/L   ALT 15 (L) 17 - 63 U/L   Alkaline Phosphatase 53 38 - 126 U/L   Total Bilirubin 1.1 0.3 - 1.2 mg/dL   GFR calc non Af Amer 42 (L) >60 mL/min   GFR calc  Af Amer 49 (L) >60 mL/min    Comment: (NOTE) The eGFR has been calculated using the CKD EPI equation. This calculation has not been validated in all clinical situations. eGFR's persistently <60 mL/min signify possible Chronic Kidney Disease.    Anion gap 7 5 - 15  Lipase, blood     Status: None   Collection Time: 08/22/16 10:25 AM  Result Value Ref Range   Lipase 28 11 - 51 U/L  Type and screen     Status: None   Collection Time: 08/22/16 10:25 AM  Result Value Ref Range   ABO/RH(D) O POS    Antibody Screen NEG    Sample Expiration 08/25/2016   ABO/Rh     Status: None   Collection Time: 08/22/16 10:25 AM  Result Value Ref Range   ABO/RH(D) O POS   I-Stat Chem 8, ED     Status: Abnormal   Collection Time: 08/22/16 10:43 AM  Result Value Ref Range   Sodium 136 135 - 145 mmol/L   Potassium 4.3 3.5 - 5.1 mmol/L   Chloride 104 101 - 111 mmol/L   BUN 33 (H) 6 - 20 mg/dL   Creatinine, Ser 1.60 (H) 0.61 - 1.24 mg/dL   Glucose, Bld 130 (H) 65 - 99 mg/dL   Calcium, Ion 1.09 (L) 1.15 - 1.40 mmol/L   TCO2 22 0 - 100 mmol/L   Hemoglobin 11.6 (L) 13.0 - 17.0  g/dL   HCT 34.0 (L) 39.0 - 52.0 %    Ct Abdomen Pelvis W Contrast  Result Date: 08/22/2016 CLINICAL DATA:  Sudden onset Mid to epigastric pain. Patient had a syncopal episode this morning and vomited blood. EXAM: CT ABDOMEN AND PELVIS WITH CONTRAST TECHNIQUE: Multidetector CT imaging of the abdomen and pelvis was performed using the standard protocol following bolus administration of intravenous contrast. CONTRAST:  58m ISOVUE-300 IOPAMIDOL (ISOVUE-300) INJECTION 61% COMPARISON:  None. FINDINGS: Lower chest: Clear lung bases. Heart normal size. Dense coronary artery calcifications. Hepatobiliary: No liver mass or focal lesion. Gallbladder surgically absent. There is mild chronic intra and extrahepatic bile duct prominence with normal common bile duct distal tapering. Pancreas: Absent pancreatic tail consistent with previous resection. Otherwise unremarkable. Spleen: Status post spleen ectomy. Adrenals/Urinary Tract: Mild renal cortical thinning. Small bilateral nonobstructing intrarenal stones. No masses. No hydronephrosis. Normal ureters. Bladder is unremarkable. Stomach/Bowel: Stomach is not well distended. No convincing mass, ulceration or inflammation. Small bowel is unremarkable. There several left colon diverticula. No diverticulitis. Colon otherwise unremarkable. Normal appendix visualized. Vascular/Lymphatic: Abdominal aorta is distended to 2.8 cm in its infrarenal portion. There is atherosclerotic calcification along the abdominal aorta its branch vessels. Prominent nodes are noted along the periceliac and gastrohepatic ligament chains, largest measuring 1 cm short axis. These are most likely reactive. Reproductive: Prostate mildly bulges against the posterior inferior bladder base. It is mildly enlarged. Other: No abdominal wall hernia or abnormality. No abdominopelvic ascites. Musculoskeletal: Mild depression of the upper endplate of L2 with associated prominent Schmorl's node. This appears  chronic. No other fractures. Mild disc degenerative change at L5-S1. No osteoblastic or osteolytic lesions. IMPRESSION: 1. No acute findings within the abdomen or pelvis. 2. Normal appendix visualized. 3. Status post cholecystectomy. 4. Small nonobstructing intrarenal stones. 5. Ectatic abdominal aorta at risk for aneurysm development. Recommend followup by ultrasound in 5 years. This recommendation follows ACR consensus guidelines: White Paper of the ACR Incidental Findings Committee II on Vascular Findings. J Am Coll Radiol 2013; 10:789-794. 6. Aortic atherosclerosis. 7. Prominent  upper abdominal lymph nodes as described most likely reactive. Electronically Signed   By: Lajean Manes M.D.   On: 08/22/2016 12:06    ROS negative except above Blood pressure 156/69, pulse (!) 132, temperature 97.4 F (36.3 C), temperature source Oral, resp. rate 19, height 5' 10"  (1.778 m), weight 71.2 kg (157 lb), SpO2 (!) 72 %. Physical Exam vital signs stable afebrile no acute distress lungs are clear heart regular rate and rhythm abdomen is soft nontender labs reviewed CT reviewed Assessment/Plan: Upper GI bleeding in patient on aspirin and Plavix Plan: The risks benefits methods of endoscopy was discussed with the patient and his family and will proceed this afternoon with further workup and plans pending the findings  Calaya Gildner E 08/22/2016, 3:08 PM

## 2016-08-22 NOTE — ED Notes (Signed)
Attempted report x1. 

## 2016-08-22 NOTE — Op Note (Signed)
[facility] Patient Name: Marc Adams Procedure Date : 08/22/2016 MRN: [id number] Attending MD: Clarene Essex , MD Date of Birth: 1934/12/10 CSN: [id number] Age: 80 Admit Type: Outpatient Procedure:                Upper GI endoscopy Indications:              Hematemesis Providers:                Clarene Essex, MD, Sarah Monday RN, RN, Elspeth Cho                            Tech., Technician Referring MD:              Medicines:                Fentanyl 30 micrograms IV, Midazolam 4 mg IV,                            Cetacaine spray Complications:            No immediate complications. Estimated Blood Loss:     Estimated blood loss: none. Procedure:                Pre-Anesthesia Assessment:                           - Prior to the procedure, a History and Physical                            was performed, and patient medications and                            allergies were reviewed. The patient's tolerance of                            previous anesthesia was also reviewed. The risks                            and benefits of the procedure and the sedation                            options and risks were discussed with the patient.                            All questions were answered, and informed consent                            was obtained. Prior Anticoagulants: The patient has                            taken Plavix (clopidogrel), last dose was day of                            procedure. ASA Grade Assessment: II - A patient  with mild systemic disease. After reviewing the                            risks and benefits, the patient was deemed in                            satisfactory condition to undergo the procedure.                           After obtaining informed consent, the endoscope was                            passed under direct vision. Throughout the                            procedure, the patient's blood pressure,  pulse, and                            oxygen saturations were monitored continuously. The                            [id number] scope was introduced through the                            mouth, and advanced to the second part of duodenum.                            The upper GI endoscopy was accomplished without                            difficulty. The patient tolerated the procedure                            well. Scope In: Scope Out: Findings:      The larynx was normal.      A tiny hiatal hernia was present.      A medium-sized, ulcerated, non-circumferential mass with no bleeding and       stigmata of recent bleeding was found on the greater curvature of the       stomach. Biopsies were taken with a cold forceps for histology.      A single medium pedunculated polyp was found in the prepyloric region of       the stomach. Biopsies were taken with a cold forceps for histology.      Diffuse moderate inflammation characterized by congestion (edema) was       found in the entire examined stomach.      Clotted blood was found on the greater curvature of the stomach and on       the lesser curvature of the stomach.      The duodenal bulb, first portion of the duodenum and second portion of       the duodenum were normal.      The exam was otherwise without abnormality. Impression:               - Normal larynx.                           -  Tiny hiatal hernia.                           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 Biopsied.                           - A single gastric polyp. Biopsied.                           - Atrophic gastritis.                           - Clotted blood in the greater curvature of the                            stomach and in the lesser curvature of the stomach.                           - Normal duodenal bulb, first portion of the                            duodenum and second portion  of the duodenum.                           - The examination was otherwise normal. Moderate Sedation:      Moderate (conscious) sedation was administered by the endoscopy nurse       and supervised by the endoscopist. The following parameters were       monitored: oxygen saturation, heart rate, blood pressure, respiratory       rate, EKG, adequacy of pulmonary ventilation, and response to care. Recommendation:           - Patient has a contact number available for                            emergencies. The signs and symptoms of potential                            delayed complications were discussed with the                            patient. Return to normal activities tomorrow.                            Written discharge instructions were provided to the                            patient.                           - Clear liquid diet today.                           -  Resume Plavix (clopidogrel) at prior dose to be                            determined.                           - Await pathology results.                           - Return to GI clinic PRN.                           - Telephone GI clinic for pathology results in 3                            days.                           - Telephone GI clinic if symptomatic PRN. Procedure Code(s):        --- Professional ---                           [phone], Esophagogastroduodenoscopy, flexible,                            transoral; with biopsy, single or multiple Diagnosis Code(s):        --- Professional ---                           K44.9, Diaphragmatic hernia without obstruction or                            gangrene                           D49.0, Neoplasm of unspecified behavior of                            digestive system                           K31.7, Polyp of stomach and duodenum                           K29.40, Chronic atrophic gastritis without bleeding                           K92.2, Gastrointestinal  hemorrhage, unspecified                           K92.0, Hematemesis CPT copyright 2016 American Medical Association. All rights reserved. The codes documented in this report are preliminary and upon coder review may  be revised to meet current compliance requirements. Clarene Essex, MD 08/22/2016 4:11:11 PM This report has been signed electronically. Number of Addenda: 0

## 2016-08-22 NOTE — ED Notes (Signed)
Patient transported to CT 

## 2016-08-22 NOTE — ED Notes (Signed)
Pt ambulatory to the bathroom without difficulty.
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
## 2016-08-23 DIAGNOSIS — K922 Gastrointestinal hemorrhage, unspecified: Principal | ICD-10-CM

## 2016-08-23 DIAGNOSIS — C801 Malignant (primary) neoplasm, unspecified: Secondary | ICD-10-CM

## 2016-08-23 DIAGNOSIS — R110 Nausea: Secondary | ICD-10-CM

## 2016-08-23 DIAGNOSIS — C169 Malignant neoplasm of stomach, unspecified: Secondary | ICD-10-CM | POA: Diagnosis not present

## 2016-08-23 DIAGNOSIS — K920 Hematemesis: Secondary | ICD-10-CM

## 2016-08-23 DIAGNOSIS — D500 Iron deficiency anemia secondary to blood loss (chronic): Secondary | ICD-10-CM | POA: Diagnosis not present

## 2016-08-23 DIAGNOSIS — K2940 Chronic atrophic gastritis without bleeding: Secondary | ICD-10-CM | POA: Diagnosis not present

## 2016-08-23 DIAGNOSIS — D490 Neoplasm of unspecified behavior of digestive system: Secondary | ICD-10-CM | POA: Diagnosis not present

## 2016-08-23 LAB — BASIC METABOLIC PANEL
Anion gap: 7 (ref 5–15)
BUN: 38 mg/dL — ABNORMAL HIGH (ref 6–20)
CALCIUM: 9.1 mg/dL (ref 8.9–10.3)
CO2: 24 mmol/L (ref 22–32)
CREATININE: 1.56 mg/dL — AB (ref 0.61–1.24)
Chloride: 105 mmol/L (ref 101–111)
GFR calc Af Amer: 47 mL/min — ABNORMAL LOW (ref >60)
GFR calc non Af Amer: 41 mL/min — ABNORMAL LOW (ref >60)
GLUCOSE: 106 mg/dL — AB (ref 65–99)
Potassium: 4.1 mmol/L (ref 3.5–5.1)
Sodium: 136 mmol/L (ref 135–145)

## 2016-08-23 LAB — CBC WITH DIFFERENTIAL/PLATELET
BASOS PCT: 0 %
Basophils Absolute: 0 10*3/uL (ref 0.0–0.1)
Eosinophils Absolute: 0 10*3/uL (ref 0.0–0.7)
Eosinophils Relative: 0 %
HCT: 30.6 % — ABNORMAL LOW (ref 39.0–52.0)
HEMOGLOBIN: 10.1 g/dL — AB (ref 13.0–17.0)
LYMPHS PCT: 37 %
Lymphs Abs: 7 10*3/uL — ABNORMAL HIGH (ref 0.7–4.0)
MCH: 29.6 pg (ref 26.0–34.0)
MCHC: 33 g/dL (ref 30.0–36.0)
MCV: 89.7 fL (ref 78.0–100.0)
MONOS PCT: 10 %
Monocytes Absolute: 1.9 10*3/uL — ABNORMAL HIGH (ref 0.1–1.0)
NEUTROS ABS: 10.1 10*3/uL — AB (ref 1.7–7.7)
Neutrophils Relative %: 53 %
Platelets: 372 10*3/uL (ref 150–400)
RBC: 3.41 MIL/uL — ABNORMAL LOW (ref 4.22–5.81)
RDW: 14.6 % (ref 11.5–15.5)
WBC: 19 10*3/uL — ABNORMAL HIGH (ref 4.0–10.5)

## 2016-08-23 LAB — GLUCOSE, CAPILLARY
GLUCOSE-CAPILLARY: 143 mg/dL — AB (ref 65–99)
GLUCOSE-CAPILLARY: 89 mg/dL (ref 65–99)
Glucose-Capillary: 115 mg/dL — ABNORMAL HIGH (ref 65–99)

## 2016-08-23 LAB — MRSA PCR SCREENING: MRSA by PCR: NEGATIVE

## 2016-08-23 LAB — PROTIME-INR
INR: 1.13
Prothrombin Time: 14.5 seconds (ref 11.4–15.2)

## 2016-08-23 LAB — PATHOLOGIST SMEAR REVIEW

## 2016-08-23 MED ORDER — PANTOPRAZOLE SODIUM 40 MG PO TBEC: 40.0000 mg | DELAYED_RELEASE_TABLET | Freq: Every day | ORAL | 0 refills | Status: AC

## 2016-08-23 MED ORDER — ROSUVASTATIN CALCIUM 10 MG PO TABS: 20.0000 mg | ORAL_TABLET | Freq: Every day | ORAL | Status: DC

## 2016-08-23 MED ORDER — RANOLAZINE ER 500 MG PO TB12: 500.0000 mg | ORAL_TABLET | Freq: Two times a day (BID) | ORAL | Status: DC

## 2016-08-23 NOTE — Progress Notes (Signed)
Family Medicine Teaching Service Daily Progress Note Intern Pager: [phone]  Patient name: Marc Adams Medical record number: [id number] Date of birth: 1935/01/31 Age: 81 y.o. Gender: male  Primary Care Provider: No primary care provider on file. Consultants: GI Code Status: Full  Pt Overview and Major Events to Date:  9/14 Admitted to step down for upper GI bleed. EGD done.  Assessment and Plan: Marc Adams is a 81 y.o. male presenting with hematemesis with PMH significant for CAD, CABG, chronic diastolic CHF, HTN, [id number], hx pancreatic CA s/p pancreatectomy/splenectomy, gout, chronic chest pain, CKD.  # Upper GI bleed: Presented with 1 day hx hematemesis, no hematochezia or dark stool. Hgb 10.9. HDS, however patient was noted to be weak and SOB, also very cold and shivering.  Use of multiple daily gastric irritating meds such as NSAIDs. No acute finding on CT. PT/INR normal. EGD showed likely malignant gastric tumor. -GI recs -advance diet as tolerated -Protonix PO per GI  -Holding home PO meds for now -d/c IVF -consult PT - will attempt to get f/u with his oncologist in [facility]. - needs f/u with PCP or oncologist within next week to recheck hemoglobin.   # Acute blood loss anemia, normocytic: Hgb 10.9 on admit. Today 10.1. Currently hemodynamically stable. No visible signs of active bleeding. Type and screen done. Have 2 large bore IVs. -monitor CBC  # CKD: Creatinine 1.6, it seems like this is his baseline. Daughter reports baseline is 2? Sees Dr. Lorrene Reid, [facility]. - will monitor creatinine - Avoid nephrotoxic drugs- -discussed avoiding NSAIDs given GI bleed and CKD  # HTN: stable. Have not yet restarted BP meds. -holding PO home meds for now - will re-start meds   #CAD and chronic diastolic HF per cardiology notes: Sees Dr. Bettina Gavia in [place]. - consider restarting statin, metoprolol, ACE-I - re-starting Ranexa and statin   PO - holding Plavix and ASA for now.  - consider touching base with cardiology about duration of Plavix as the pt notes he has not had a stent placed since the 1980s.   # T2DM: stable per patient.  - CBGs - SSI  # Ectatic abdominal aorta: Incidental finding-Ectatic abdominal aorta at risk for aneurysm development. - Per radiology: Recommend followup by ultrasound in 5 years.   FEN/GI: advanced as tolerated  Prophylaxis: SCDs , no pharmacologics given GI bleed  Disposition: Home  Subjective:  Marc Adams was resting comfortably in bed. Denies nausea, vomiting overnight. Reports 2 dark stools. Able to ambulate well to bathroom. Has minor headache today, believes it would improve if he could eat. Tolerating clear liquids well. Says he plans on talking to his oncologist that he saw for pancreatic CA.   Objective: Temp:  [97.4 F (36.3 C)-100 F (37.8 C)] 100 F (37.8 C) (09/15 0737) Pulse Rate:  [48-132] 84 (09/15 0737) Resp:  [11-25] 17 (09/15 0737) BP: (94-158)/(39-96) 116/61 (09/15 0737) SpO2:  [72 %-100 %] 98 % (09/15 0616) Weight:  [71.2 kg (157 lb)-72.8 kg (160 lb 7.9 oz)] 72.8 kg (160 lb 7.9 oz) (09/15 0500) Physical Exam: General: Alert and awake male sitting up in bed in NAD Cardiovascular: RRR, no murmur Respiratory: CTAB, no increased work of breathing Abdomen: Soft, not distended, bowel sounds present, LQ slightly tender to palpation, no rebound or guarding.  Extremities: no cyanosis, edema, normal capillary refill  Laboratory:  Recent Labs Lab 08/22/16 1025 08/22/16 1043 08/23/16 0303  WBC 13.8*  --  19.0*  HGB 10.9* 11.6* 10.1*  HCT 33.4* 34.0* 30.6*  PLT 406*  --  372    Recent Labs Lab 08/22/16 1025 08/22/16 1043 08/23/16 0303  NA 136 136 136  K 4.3 4.3 4.1  CL 106 104 105  CO2 23  --  24  BUN 33* 33* 38*  CREATININE 1.51* 1.60* 1.56*  CALCIUM 9.1  --  9.1  PROT 6.2*  --   --   BILITOT 1.1  --   --   ALKPHOS 53  --   --   ALT 15*  --   --    AST 22  --   --   GLUCOSE 135* 130* 106*    INR 1.13 PT 14.5  Imaging/Diagnostic Tests: EGD 08/22/16 Impression:       - Normal larynx.                           - Tiny hiatal hernia.                           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 Biopsied.                           - A single gastric polyp. Biopsied.                           - Atrophic gastritis.                           - Clotted blood in the greater curvature of the                            stomach and in the lesser curvature of the stomach.                           - Normal duodenal bulb, first portion of the                            duodenum and second portion of the duodenum.   Georgina Pillion, Medical Student 08/23/2016, 9:09 AM [place] Intern pager: [phone], text pages welcome  RESIDENT ADDENDUM  I have separately seen and examined the patient. I have discussed the findings and exam with the medical student and agree with the above note, which I have edited appropriately. I helped develop the management plan that is described in the student's note, and I agree with the content.  Additionally I have outlined my exam and assessment/plan below:   Patient doing well. Now having black sticky stools. No more hematemesis or coughing up blood. He denies SOB or chest pain. He notes when we're standing up to show pictures, he becomes slightly dizzy and has to lie down.  PE:  Blood pressure 116/61, pulse 84, temperature 100 F (37.8 C), temperature source Oral, resp. rate 17, height 5\' 10"  (1.778 m), weight 160 lb 7.9 oz (72.8 kg), SpO2 98 %. General: Sitting up in bedside chair  in NAD. Non-toxic. Pleasant Eyes: Conjunctivae non-injected.  ENTM: Moist mucous membranes. Oropharynx clear. No nasal discharge.  Neck: Supple, no LAD Cardiovascular: RRR. No murmurs, rubs, or gallops noted. No pitting edema noted. Respiratory:  No increased WOB. CTAB without wheezing, rhonchi, or crackles noted. Abdomen: +BS, soft, non-distended, non-tender.  Normal gait with some wavering when dizziness occurs   A/P:  81 y/o male with a PMHx of pancreatic cancer presenting with hematemesis found to have a gastric tumor on EGD concerning for malignancy.   - GI following - tolerating clear fluids, asking for food - continue to monitor for GIB  - needs f/u with his oncologist in ~1 week - f/u pathology results - f/u CBC in outpatient setting - continue PPI on d/c - holding plavix, ASA, colchicine, NSAIDS (his cardiology is aware). - slightly weak, will c/s PT  BPs soft/normotensive off all antihypertensives  - consider restarting on outpatient.   Archie Patten, MD PGY-3,  [facility] 08/23/2016  10:09 AM

## 2016-08-23 NOTE — Progress Notes (Signed)
Patient being discharged home. Patient/wife given discharge instructions. Patient instructed to pick up new medication from pharmacy. Patient educated on new medication. Patient/wife verbalized understanding. Patient discharged home with wife.

## 2016-08-23 NOTE — Progress Notes (Signed)
Discussed past with pathology and patient and family and hospital team and answered all of their questions and agree with discharge and holding blood thinners for a while and await oncology opinion on Monday as an outpatient and I wish the patient well

## 2016-08-23 NOTE — Discharge Instructions (Signed)
You came into the hospital 08/22/2016 for bloody vomit. A tumor was found in the stomach and biopsied. The biopsy showed cancer.  -- Appointment made with [facility] with Dr. Hinton Rao at 9:00 am on Monday, September 18th. Arrive at 8:30 am for lab work.   -- Do not take Plavix, Aspirin, colchicine, ibuprofen at this time. Also stop your gout medication (colchicine-probenecid).  You make take tylenol as needed for pain. Talk to your cardiologist within the next month for further recommendations, and when they might recommend you restart these medications.  --Your blood pressure has been good while you've been here- we have not had you on clonidine, amlodipine, metoprolol, or lisinopril. Do NOT re-start these medications until instructed by your doctor.   --Call Dr. Unk Lightning or go to the emergency room if you have additional vomiting of blood or feel like you might pass out.

## 2016-08-23 NOTE — Evaluation (Signed)
Physical Therapy Evaluation Patient Details Name: Snow Fleeger MRN: [id number] DOB: 05-23-1935 Today's Date: 08/23/2016   History of Present Illness  Syeed Rathgeber is a 81 y.o. male presenting with hematemesis with PMH significant for CAD, CABG, HTN, T2DM, hx pancreatic CA s/p pancreatectomy/splenectomy, gout, chronic chest pain, CKD.  Clinical Impression  Patient evaluated by Physical Therapy with no further acute PT needs identified. All education has been completed and the patient has no further questions. * See below for any follow-up Physical Therapy or equipment needs. PT is signing off. Thank you for this referral. Pt doing very well, reports he feels a little weaker than his baseline but actually felt stronger while amb, mild SOB which appeared to resolve-- able to amb 450'; no further needs at this time;    Follow Up Recommendations No PT follow up    Equipment Recommendations  None recommended by PT    Recommendations for Other Services       Precautions / Restrictions Precautions Precautions: None Restrictions Weight Bearing Restrictions: No      Mobility  Bed Mobility Overal bed mobility: Modified Independent                Transfers Overall transfer level: Modified independent Equipment used: None                Ambulation/Gait Ambulation/Gait assistance: Supervision Ambulation Distance (Feet): 450 Feet Assistive device: None Gait Pattern/deviations: Step-through pattern        Stairs            Wheelchair Mobility    Modified Rankin (Stroke Patients Only)       Balance Overall balance assessment: Needs assistance   Sitting balance-Leahy Scale: Good       Standing balance-Leahy Scale: Good               High level balance activites: Turns;Side stepping;Head turns High Level Balance Comments: no LOB             Pertinent Vitals/Pain Pain Assessment: No/denies pain    Home Living Family/patient  expects to be discharged to:: Private residence Living Arrangements: Spouse/significant other Available Help at Discharge: Family Type of Home: House Home Access: Stairs to enter   Technical brewer of Steps: 2 Home Layout: One level Home Equipment: None      Prior Function Level of Independence: Independent               Hand Dominance        Extremity/Trunk Assessment   Upper Extremity Assessment: Overall WFL for tasks assessed           Lower Extremity Assessment: Overall WFL for tasks assessed         Communication      Cognition Arousal/Alertness: Awake/alert Behavior During Therapy: WFL for tasks assessed/performed Overall Cognitive Status: Within Functional Limits for tasks assessed                      General Comments      Exercises     Assessment/Plan    PT Assessment Patent does not need any further PT services  PT Problem List         PT Diagnosis   Hematemesis with nausea  Difficulty in walking, not elsewhere classified    PT Treatment Interventions      PT Goals (Current goals can be found in the Care Plan section)  Acute Rehab PT Goals Patient Stated Goal: home  soon PT Goal Formulation: All assessment and education complete, DC therapy    Frequency     Barriers to discharge        Co-evaluation               End of Session   Activity Tolerance: Patient tolerated treatment well Patient left: with call bell/phone within reach;in chair;with family/visitor present           Time: 1240-1258 PT Time Calculation (min) (ACUTE ONLY): 18 min   Charges:   PT Evaluation $PT Eval Low Complexity: 1 Procedure     PT G Codes:        Jawuan Robb 08-29-2016, 1:11 PM

## 2016-08-23 NOTE — Discharge Summary (Signed)
[facility] Discharge Summary  Patient name: Marc Adams Medical record number: [id number] Date of birth: Oct 12, 1935 Age: 81 y.o. Gender: male Date of Admission: 08/22/2016  Date of Discharge: 08/23/2016 Admitting Physician: Blane Ohara McDiarmid, MD  Primary Care Provider: No primary care provider on file. Dr. Unk Lightning Consultants: GI  Indication for Hospitalization: Upper GI Bleed  Discharge Diagnoses/Problem List:  1. Upper GI bleed 2. Adenocarcinoma noted on gastric mass biopsy 3. Anemia, normocytic, acute blood loss 4. HTN 5. T2DM 6. CAD 7.  CKD, stage III 8. Chronic diastolic CHF 9. Ectatic abdominal aorta, incidental finding on CT  Disposition: Home  Discharge Condition: stable  Discharge Exam:  General: Alert and awake male sitting up in bed in NAD Cardiovascular: RRR, no murmur Respiratory: CTAB, no increased work of breathing Abdomen: Soft, not distended, bowel sounds present, lower quadrant slightly tender to palpation, no rebound or guarding.  Extremities: no cyanosis, edema, normal capillary refill  Brief Hospital Course:  [name] presented to the ED via EMS 08/22/16 with one day history of hematemesis with associated SOB and LOC. No hematochezia or melena reported at that time. Hemodynamically stable. Patient had been taking ASA, Plavix, ibuprofen, and colchicine daily. Treated with protonix, IVF, PO meds held. GI consulted. EGD showed likely malignant gastric tumor on greater curvature of stomach.  There was no active bleeding noted on EGD.  Pathology of biopsy showed adenocarcinoma. He has previous hx of pancreatic cancer s/p pancreatectomy.  His hemoglobin was stable overnight and he had no more episodes of bleeding. Patient stable for discharge and further outpatient management. He opted to follow up with his outpatient oncologist. Dr.Mccarty.   Issues for Follow Up:  1. New diagnosis gastric adenocarcinoma: grade still  pending 2. Anemia, check CBC in  <1 week 3. ASA and Plavix held d/t upper GI bleed (discussed with his cardiologist) 4. HTN meds held inpatient with normal BPs. Should follow with cardiologist, Dr. Bettina Gavia, outpatient for further management.  Significant Procedures:  CT Abd/Pelvis 08/22/16 EGD 08/22/16  Significant Labs and Imaging:   Recent Labs Lab 08/22/16 1025 08/22/16 1043 08/23/16 0303  WBC 13.8*  --  19.0*  HGB 10.9* 11.6* 10.1*  HCT 33.4* 34.0* 30.6*  PLT 406*  --  372    Recent Labs Lab 08/22/16 1025 08/22/16 1043 08/23/16 0303  NA 136 136 136  K 4.3 4.3 4.1  CL 106 104 105  CO2 23  --  24  GLUCOSE 135* 130* 106*  BUN 33* 33* 38*  CREATININE 1.51* 1.60* 1.56*  CALCIUM 9.1  --  9.1  ALKPHOS 53  --   --   AST 22  --   --   ALT 15*  --   --   ALBUMIN 3.4*  --   --    PT 14.5 INR 1.13  Ct Abdomen Pelvis W Contrast  Result Date: 08/22/2016 CLINICAL DATA:  Sudden onset Mid to epigastric pain. Patient had a syncopal episode this morning and vomited blood. EXAM: CT ABDOMEN AND PELVIS WITH CONTRAST TECHNIQUE: Multidetector CT imaging of the abdomen and pelvis was performed using the standard protocol following bolus administration of intravenous contrast. CONTRAST:  37m ISOVUE-300 IOPAMIDOL (ISOVUE-300) INJECTION 61% COMPARISON:  None. FINDINGS: Lower chest: Clear lung bases. Heart normal size. Dense coronary artery calcifications. Hepatobiliary: No liver mass or focal lesion. Gallbladder surgically absent. There is mild chronic intra and extrahepatic bile duct prominence with normal common bile duct distal tapering. Pancreas: Absent pancreatic  tail consistent with previous resection. Otherwise unremarkable. Spleen: Status post spleen ectomy. Adrenals/Urinary Tract: Mild renal cortical thinning. Small bilateral nonobstructing intrarenal stones. No masses. No hydronephrosis. Normal ureters. Bladder is unremarkable. Stomach/Bowel: Stomach is not well distended. No convincing  mass, ulceration or inflammation. Small bowel is unremarkable. There several left colon diverticula. No diverticulitis. Colon otherwise unremarkable. Normal appendix visualized. Vascular/Lymphatic: Abdominal aorta is distended to 2.8 cm in its infrarenal portion. There is atherosclerotic calcification along the abdominal aorta its branch vessels. Prominent nodes are noted along the periceliac and gastrohepatic ligament chains, largest measuring 1 cm short axis. These are most likely reactive. Reproductive: Prostate mildly bulges against the posterior inferior bladder base. It is mildly enlarged. Other: No abdominal wall hernia or abnormality. No abdominopelvic ascites. Musculoskeletal: Mild depression of the upper endplate of L2 with associated prominent Schmorl's node. This appears chronic. No other fractures. Mild disc degenerative change at L5-S1. No osteoblastic or osteolytic lesions. IMPRESSION: 1. No acute findings within the abdomen or pelvis. 2. Normal appendix visualized. 3. Status post cholecystectomy. 4. Small nonobstructing intrarenal stones. 5. Ectatic abdominal aorta at risk for aneurysm development. Recommend followup by ultrasound in 5 years. This recommendation follows ACR consensus guidelines: White Paper of the ACR Incidental Findings Committee II on Vascular Findings. J Am Coll Radiol 2013; 10:789-794. 6. Aortic atherosclerosis. 7. Prominent upper abdominal lymph nodes as described most likely reactive. Electronically Signed   By: Lajean Manes M.D.   On: 08/22/2016 12:06    EGD 08/22/16 Findings:      The larynx was normal.      A tiny hiatal hernia was present.      A medium-sized, ulcerated, non-circumferential mass with no bleeding and  stigmata of recent bleeding was found on the greater curvature of the stomach. Biopsies were taken with a cold forceps for histology.      A single medium pedunculated polyp was found in the prepyloric region of  the stomach. Biopsies were taken with a  cold forceps for histology.      Diffuse moderate inflammation characterized by congestion (edema) was found in the entire examined stomach.      Clotted blood was found on the greater curvature of the stomach and on the lesser curvature of the stomach.      The duodenal bulb, first portion of the duodenum and second portion of the duodenum were normal. The exam was otherwise without abnormality. Impression:               - Normal larynx.                           - Tiny hiatal hernia.                           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 Biopsied.                           - A single gastric polyp. Biopsied.                           - Atrophic gastritis.                           -  Clotted blood in the greater curvature of the                            stomach and in the lesser curvature of the stomach.                           - Normal duodenal bulb, first portion of the                            duodenum and second portion of the duodenum.                           - The examination was otherwise normal.  Recommendation:           - Patient has a contact number available for                            emergencies. The signs and symptoms of potential                            delayed complications were discussed with the                            patient. Return to normal activities tomorrow.                            Written discharge instructions were provided to the                            patient.                           - Clear liquid diet today.                           - Resume Plavix (clopidogrel) at prior dose to be                            determined.                           - Await pathology results.                           - Return to GI clinic PRN.                           - Telephone GI clinic for pathology results in 3                            days.                           -  Telephone GI clinic if symptomatic PRN.  PATHOLOGY REPORT Diagnosis 1. Stomach, biopsy, Proximal - POSITIVE FOR ADENOCARCINOMA. - SEE COMMENT. 2. Stomach, polyp(s), Antral - ACUTE  AND CHRONICALLY INFLAMED ULCERATED/ ERODED HYPERPLASTIC GASTRIC POLYP, FRAGMENTED. - NO INTESTINAL METAPLASIA, DYSPLASIA, OR MALIGNANCY. - SEE COMMENT. Microscopic Comment 1. and 2. Of note, both specimens demonstrate associated fairly marked acute and chronic inflammation. As this is the case, a Warthin-Starry stain will be performed on both specimens to assess for Helicobacter pylori. The results of the Warthin-Starry stain for both specimens will be reported in an addendum to follow. 1. and 2. The above diagnoses from both specimens are called to Dr. Watt Climes on 08/23/16. Dr. Lyndon Code has seen the first specimen in consultation with agreement. (RAH;gt, 08/23/16) Willeen Niece MD Pathologist, Electronic Signature (Case signed 08/23/2016) Specimen Gross and Clinical Information Specimen(s) Obtained: 1. Stomach, biopsy, Proximal 2. Stomach, polyp(s), Antral Specimen Clinical Information 1. UGIB; EGD proximal stomach mass with cold bx, antral polyp cold bx (nt) 1 of 2 FINAL for ZACARY, BAUER H [phone]) Gross 1. Received in formalin are tan, soft tissue fragments that are submitted in toto. Number: multiple, Size: 0.3 cm smallest to 0.5 cm largest, (1 B) 2. Received in formalin are tan, soft tissue fragments that are submitted in toto. Number: multiple, Size: 0.1 cm smallest to 0.3 cm largest, (1 B) (KL:kh 08-22-16)  Results/Tests Pending at Time of Discharge: None  Discharge Medications:    Medication List    STOP taking these medications   amLODipine 5 MG tablet Commonly known as:  NORVASC   cloNIDine 0.1 MG tablet Commonly known as:  CATAPRES   clopidogrel 75 MG tablet Commonly known as:  PLAVIX   colchicine-probenecid 0.5-500 MG tablet   diphenhydramine-acetaminophen 25-500 MG Tabs  tablet Commonly known as:  TYLENOL PM   GOODSENSE ASPIRIN 325 MG tablet Generic drug:  aspirin   lisinopril 20 MG tablet Commonly known as:  PRINIVIL,ZESTRIL   metoprolol succinate 50 MG 24 hr tablet Commonly known as:  TOPROL-XL     TAKE these medications   B-12 1000 MCG/ML Kit Inject 1 Syringe as directed every 30 (thirty) days.   Fish Oil 1200 MG Caps Take 1,200 mg by mouth 2 (two) times daily.   furosemide 40 MG tablet Commonly known as:  LASIX Take 40 mg by mouth 2 (two) times daily.   glucosamine-chondroitin 500-400 MG tablet Take 2 tablets by mouth daily.   MENS ONE DAILY PO Take 1 tablet by mouth daily.   pantoprazole 40 MG tablet Commonly known as:  PROTONIX Take 1 tablet (40 mg total) by mouth daily. Start taking on:  08/24/2016   ranolazine 500 MG 12 hr tablet Commonly known as:  RANEXA Take 500 mg by mouth 2 (two) times daily.   rosuvastatin 20 MG tablet Commonly known as:  CRESTOR Take 20 mg by mouth every evening.   sitaGLIPtin 100 MG tablet Commonly known as:  JANUVIA Take 100 mg by mouth daily.   vitamin C 500 MG tablet Commonly known as:  ASCORBIC ACID Take 500 mg by mouth daily.       Discharge Instructions: Please refer to Patient Instructions section of EMR for full details.  Patient was counseled important signs and symptoms that should prompt return to medical care, changes in medications, dietary instructions, activity restrictions, and follow up appointments.   Follow-Up Appointments: Follow-up Information    Derwood Kaplan, MD. Go on 08/26/2016.   Specialty:  Oncology Why:  at 9:00 am. Arrive at 8:30 for lab work. Contact information: [address] [place]. [place] [address] [place], [address].  [place]: Monday, 08/26/2016 9:00 am. --(Fax records to [phone])  Note written with the assistance of MS4 Georgina Pillion with my corrections/additions.  Archie Patten, MD 08/23/2016, 4:01  PM PGY-3, [place]

## 2016-08-26 DIAGNOSIS — D72829 Elevated white blood cell count, unspecified: Secondary | ICD-10-CM

## 2016-08-26 DIAGNOSIS — C166 Malignant neoplasm of greater curvature of stomach, unspecified: Secondary | ICD-10-CM | POA: Diagnosis not present

## 2016-08-26 DIAGNOSIS — C252 Malignant neoplasm of tail of pancreas: Secondary | ICD-10-CM | POA: Diagnosis not present

## 2016-08-26 DIAGNOSIS — I2510 Atherosclerotic heart disease of native coronary artery without angina pectoris: Secondary | ICD-10-CM

## 2016-08-26 DIAGNOSIS — N189 Chronic kidney disease, unspecified: Secondary | ICD-10-CM | POA: Diagnosis not present

## 2016-08-26 DIAGNOSIS — C169 Malignant neoplasm of stomach, unspecified: Secondary | ICD-10-CM | POA: Diagnosis not present

## 2016-08-26 DIAGNOSIS — M109 Gout, unspecified: Secondary | ICD-10-CM

## 2016-08-26 DIAGNOSIS — Z8507 Personal history of malignant neoplasm of pancreas: Secondary | ICD-10-CM | POA: Diagnosis not present

## 2016-08-26 DIAGNOSIS — Z9081 Acquired absence of spleen: Secondary | ICD-10-CM

## 2016-08-26 DIAGNOSIS — N183 Chronic kidney disease, stage 3 (moderate): Secondary | ICD-10-CM | POA: Diagnosis not present

## 2016-08-26 DIAGNOSIS — F419 Anxiety disorder, unspecified: Secondary | ICD-10-CM

## 2016-08-26 DIAGNOSIS — I130 Hypertensive heart and chronic kidney disease with heart failure and stage 1 through stage 4 chronic kidney disease, or unspecified chronic kidney disease: Secondary | ICD-10-CM | POA: Diagnosis not present

## 2016-08-26 DIAGNOSIS — R52 Pain, unspecified: Secondary | ICD-10-CM

## 2016-08-26 DIAGNOSIS — D472 Monoclonal gammopathy: Secondary | ICD-10-CM | POA: Diagnosis not present

## 2016-08-26 DIAGNOSIS — E1122 Type 2 diabetes mellitus with diabetic chronic kidney disease: Secondary | ICD-10-CM | POA: Diagnosis not present

## 2016-08-26 DIAGNOSIS — I509 Heart failure, unspecified: Secondary | ICD-10-CM | POA: Diagnosis not present

## 2016-08-26 DIAGNOSIS — D500 Iron deficiency anemia secondary to blood loss (chronic): Secondary | ICD-10-CM

## 2016-09-02 DIAGNOSIS — C269 Malignant neoplasm of ill-defined sites within the digestive system: Secondary | ICD-10-CM | POA: Diagnosis not present

## 2016-09-02 DIAGNOSIS — C166 Malignant neoplasm of greater curvature of stomach, unspecified: Secondary | ICD-10-CM | POA: Diagnosis not present

## 2016-09-02 DIAGNOSIS — C162 Malignant neoplasm of body of stomach: Secondary | ICD-10-CM | POA: Diagnosis not present

## 2016-09-02 DIAGNOSIS — D472 Monoclonal gammopathy: Secondary | ICD-10-CM | POA: Diagnosis not present

## 2016-09-03 DIAGNOSIS — Z9011 Acquired absence of right breast and nipple: Secondary | ICD-10-CM | POA: Diagnosis not present

## 2016-09-03 DIAGNOSIS — Z9049 Acquired absence of other specified parts of digestive tract: Secondary | ICD-10-CM | POA: Diagnosis not present

## 2016-09-03 DIAGNOSIS — E7800 Pure hypercholesterolemia, unspecified: Secondary | ICD-10-CM | POA: Diagnosis not present

## 2016-09-03 DIAGNOSIS — C162 Malignant neoplasm of body of stomach: Secondary | ICD-10-CM | POA: Diagnosis not present

## 2016-09-03 DIAGNOSIS — N189 Chronic kidney disease, unspecified: Secondary | ICD-10-CM | POA: Diagnosis not present

## 2016-09-03 DIAGNOSIS — Z90411 Acquired partial absence of pancreas: Secondary | ICD-10-CM | POA: Diagnosis not present

## 2016-09-03 DIAGNOSIS — I1310 Hypertensive heart and chronic kidney disease without heart failure, with stage 1 through stage 4 chronic kidney disease, or unspecified chronic kidney disease: Secondary | ICD-10-CM | POA: Diagnosis not present

## 2016-09-03 DIAGNOSIS — C169 Malignant neoplasm of stomach, unspecified: Secondary | ICD-10-CM | POA: Diagnosis not present

## 2016-09-05 DIAGNOSIS — C162 Malignant neoplasm of body of stomach: Secondary | ICD-10-CM | POA: Diagnosis not present

## 2016-09-09 DIAGNOSIS — I1310 Hypertensive heart and chronic kidney disease without heart failure, with stage 1 through stage 4 chronic kidney disease, or unspecified chronic kidney disease: Secondary | ICD-10-CM | POA: Diagnosis not present

## 2016-09-09 DIAGNOSIS — E7800 Pure hypercholesterolemia, unspecified: Secondary | ICD-10-CM | POA: Diagnosis not present

## 2016-09-09 DIAGNOSIS — C162 Malignant neoplasm of body of stomach: Secondary | ICD-10-CM | POA: Diagnosis not present

## 2016-09-09 DIAGNOSIS — Z87891 Personal history of nicotine dependence: Secondary | ICD-10-CM | POA: Diagnosis not present

## 2016-09-09 DIAGNOSIS — Z951 Presence of aortocoronary bypass graft: Secondary | ICD-10-CM | POA: Diagnosis not present

## 2016-09-09 DIAGNOSIS — N189 Chronic kidney disease, unspecified: Secondary | ICD-10-CM | POA: Diagnosis not present

## 2016-09-09 DIAGNOSIS — Z79899 Other long term (current) drug therapy: Secondary | ICD-10-CM | POA: Diagnosis not present

## 2016-09-09 DIAGNOSIS — C169 Malignant neoplasm of stomach, unspecified: Secondary | ICD-10-CM | POA: Diagnosis not present

## 2016-09-09 DIAGNOSIS — C166 Malignant neoplasm of greater curvature of stomach, unspecified: Secondary | ICD-10-CM | POA: Diagnosis not present

## 2016-09-09 DIAGNOSIS — Z23 Encounter for immunization: Secondary | ICD-10-CM | POA: Diagnosis not present

## 2016-09-09 DIAGNOSIS — Z452 Encounter for adjustment and management of vascular access device: Secondary | ICD-10-CM | POA: Diagnosis not present

## 2016-09-13 DIAGNOSIS — C166 Malignant neoplasm of greater curvature of stomach, unspecified: Secondary | ICD-10-CM | POA: Diagnosis not present

## 2016-09-13 DIAGNOSIS — C252 Malignant neoplasm of tail of pancreas: Secondary | ICD-10-CM | POA: Diagnosis not present

## 2016-09-13 DIAGNOSIS — D500 Iron deficiency anemia secondary to blood loss (chronic): Secondary | ICD-10-CM | POA: Diagnosis not present

## 2016-09-13 DIAGNOSIS — D472 Monoclonal gammopathy: Secondary | ICD-10-CM | POA: Diagnosis not present

## 2016-09-16 DIAGNOSIS — C252 Malignant neoplasm of tail of pancreas: Secondary | ICD-10-CM | POA: Diagnosis not present

## 2016-09-16 DIAGNOSIS — C162 Malignant neoplasm of body of stomach: Secondary | ICD-10-CM | POA: Diagnosis not present

## 2016-09-19 DIAGNOSIS — R072 Precordial pain: Secondary | ICD-10-CM | POA: Diagnosis not present

## 2016-09-19 DIAGNOSIS — R079 Chest pain, unspecified: Secondary | ICD-10-CM | POA: Diagnosis not present

## 2016-09-20 DIAGNOSIS — C166 Malignant neoplasm of greater curvature of stomach, unspecified: Secondary | ICD-10-CM | POA: Diagnosis not present

## 2016-09-23 DIAGNOSIS — C166 Malignant neoplasm of greater curvature of stomach, unspecified: Secondary | ICD-10-CM | POA: Diagnosis not present

## 2016-09-23 DIAGNOSIS — Z8507 Personal history of malignant neoplasm of pancreas: Secondary | ICD-10-CM | POA: Diagnosis not present

## 2016-09-26 DIAGNOSIS — C166 Malignant neoplasm of greater curvature of stomach, unspecified: Secondary | ICD-10-CM | POA: Diagnosis not present

## 2016-09-30 DIAGNOSIS — E876 Hypokalemia: Secondary | ICD-10-CM | POA: Diagnosis not present

## 2016-09-30 DIAGNOSIS — E860 Dehydration: Secondary | ICD-10-CM | POA: Diagnosis not present

## 2016-09-30 DIAGNOSIS — C166 Malignant neoplasm of greater curvature of stomach, unspecified: Secondary | ICD-10-CM | POA: Diagnosis not present

## 2016-10-03 DIAGNOSIS — R001 Bradycardia, unspecified: Secondary | ICD-10-CM | POA: Diagnosis not present

## 2016-10-03 DIAGNOSIS — C166 Malignant neoplasm of greater curvature of stomach, unspecified: Secondary | ICD-10-CM | POA: Diagnosis not present

## 2016-10-07 DIAGNOSIS — C259 Malignant neoplasm of pancreas, unspecified: Secondary | ICD-10-CM | POA: Diagnosis not present

## 2016-10-07 DIAGNOSIS — K1231 Oral mucositis (ulcerative) due to antineoplastic therapy: Secondary | ICD-10-CM | POA: Diagnosis not present

## 2016-10-07 DIAGNOSIS — D500 Iron deficiency anemia secondary to blood loss (chronic): Secondary | ICD-10-CM | POA: Diagnosis not present

## 2016-10-07 DIAGNOSIS — Z8507 Personal history of malignant neoplasm of pancreas: Secondary | ICD-10-CM | POA: Diagnosis not present

## 2016-10-07 DIAGNOSIS — C169 Malignant neoplasm of stomach, unspecified: Secondary | ICD-10-CM | POA: Diagnosis not present

## 2016-10-07 DIAGNOSIS — C166 Malignant neoplasm of greater curvature of stomach, unspecified: Secondary | ICD-10-CM | POA: Diagnosis not present

## 2016-10-07 DIAGNOSIS — E860 Dehydration: Secondary | ICD-10-CM | POA: Diagnosis not present

## 2016-10-14 DIAGNOSIS — C169 Malignant neoplasm of stomach, unspecified: Secondary | ICD-10-CM | POA: Diagnosis not present

## 2016-10-14 DIAGNOSIS — K1230 Oral mucositis (ulcerative), unspecified: Secondary | ICD-10-CM | POA: Diagnosis not present

## 2016-10-14 DIAGNOSIS — D500 Iron deficiency anemia secondary to blood loss (chronic): Secondary | ICD-10-CM | POA: Diagnosis not present

## 2016-10-14 DIAGNOSIS — C252 Malignant neoplasm of tail of pancreas: Secondary | ICD-10-CM | POA: Diagnosis not present

## 2016-10-14 DIAGNOSIS — Z8507 Personal history of malignant neoplasm of pancreas: Secondary | ICD-10-CM | POA: Diagnosis not present

## 2016-10-14 DIAGNOSIS — K1231 Oral mucositis (ulcerative) due to antineoplastic therapy: Secondary | ICD-10-CM | POA: Diagnosis not present

## 2016-10-14 DIAGNOSIS — C166 Malignant neoplasm of greater curvature of stomach, unspecified: Secondary | ICD-10-CM | POA: Diagnosis not present

## 2016-10-17 DIAGNOSIS — C252 Malignant neoplasm of tail of pancreas: Secondary | ICD-10-CM | POA: Diagnosis not present

## 2016-10-18 DIAGNOSIS — C166 Malignant neoplasm of greater curvature of stomach, unspecified: Secondary | ICD-10-CM | POA: Diagnosis not present

## 2016-10-19 DIAGNOSIS — R42 Dizziness and giddiness: Secondary | ICD-10-CM | POA: Diagnosis not present

## 2016-10-19 DIAGNOSIS — I160 Hypertensive urgency: Secondary | ICD-10-CM | POA: Diagnosis not present

## 2016-10-19 DIAGNOSIS — R51 Headache: Secondary | ICD-10-CM | POA: Diagnosis not present

## 2016-10-21 DIAGNOSIS — C166 Malignant neoplasm of greater curvature of stomach, unspecified: Secondary | ICD-10-CM | POA: Diagnosis not present

## 2016-10-22 DIAGNOSIS — I5032 Chronic diastolic (congestive) heart failure: Secondary | ICD-10-CM | POA: Diagnosis not present

## 2016-10-22 DIAGNOSIS — I110 Hypertensive heart disease with heart failure: Secondary | ICD-10-CM | POA: Diagnosis not present

## 2016-10-22 DIAGNOSIS — I25119 Atherosclerotic heart disease of native coronary artery with unspecified angina pectoris: Secondary | ICD-10-CM | POA: Diagnosis not present

## 2016-10-22 DIAGNOSIS — Z6823 Body mass index (BMI) 23.0-23.9, adult: Secondary | ICD-10-CM | POA: Diagnosis not present

## 2016-10-24 DIAGNOSIS — C166 Malignant neoplasm of greater curvature of stomach, unspecified: Secondary | ICD-10-CM | POA: Diagnosis not present

## 2016-10-28 DIAGNOSIS — C166 Malignant neoplasm of greater curvature of stomach, unspecified: Secondary | ICD-10-CM | POA: Diagnosis not present

## 2016-10-28 DIAGNOSIS — D500 Iron deficiency anemia secondary to blood loss (chronic): Secondary | ICD-10-CM | POA: Diagnosis not present

## 2016-10-28 DIAGNOSIS — Z8507 Personal history of malignant neoplasm of pancreas: Secondary | ICD-10-CM | POA: Diagnosis not present

## 2016-11-01 DIAGNOSIS — D472 Monoclonal gammopathy: Secondary | ICD-10-CM | POA: Diagnosis not present

## 2016-11-01 DIAGNOSIS — C166 Malignant neoplasm of greater curvature of stomach, unspecified: Secondary | ICD-10-CM | POA: Diagnosis not present

## 2016-11-04 DIAGNOSIS — C166 Malignant neoplasm of greater curvature of stomach, unspecified: Secondary | ICD-10-CM | POA: Diagnosis not present

## 2016-11-07 DIAGNOSIS — C166 Malignant neoplasm of greater curvature of stomach, unspecified: Secondary | ICD-10-CM | POA: Diagnosis not present

## 2016-11-11 DIAGNOSIS — C166 Malignant neoplasm of greater curvature of stomach, unspecified: Secondary | ICD-10-CM | POA: Diagnosis not present

## 2016-11-11 DIAGNOSIS — Z8507 Personal history of malignant neoplasm of pancreas: Secondary | ICD-10-CM | POA: Diagnosis not present

## 2016-11-11 DIAGNOSIS — C252 Malignant neoplasm of tail of pancreas: Secondary | ICD-10-CM | POA: Diagnosis not present

## 2016-11-11 DIAGNOSIS — D500 Iron deficiency anemia secondary to blood loss (chronic): Secondary | ICD-10-CM | POA: Diagnosis not present

## 2016-11-15 DIAGNOSIS — C166 Malignant neoplasm of greater curvature of stomach, unspecified: Secondary | ICD-10-CM | POA: Diagnosis not present

## 2016-11-15 DIAGNOSIS — Z8507 Personal history of malignant neoplasm of pancreas: Secondary | ICD-10-CM | POA: Diagnosis not present

## 2016-11-15 DIAGNOSIS — D500 Iron deficiency anemia secondary to blood loss (chronic): Secondary | ICD-10-CM | POA: Diagnosis not present

## 2016-11-18 DIAGNOSIS — K1231 Oral mucositis (ulcerative) due to antineoplastic therapy: Secondary | ICD-10-CM | POA: Diagnosis not present

## 2016-11-18 DIAGNOSIS — C166 Malignant neoplasm of greater curvature of stomach, unspecified: Secondary | ICD-10-CM | POA: Diagnosis not present

## 2016-11-18 DIAGNOSIS — C252 Malignant neoplasm of tail of pancreas: Secondary | ICD-10-CM | POA: Diagnosis not present

## 2016-11-19 DIAGNOSIS — I700 Atherosclerosis of aorta: Secondary | ICD-10-CM | POA: Diagnosis not present

## 2016-11-19 DIAGNOSIS — J9811 Atelectasis: Secondary | ICD-10-CM | POA: Diagnosis not present

## 2016-11-19 DIAGNOSIS — R918 Other nonspecific abnormal finding of lung field: Secondary | ICD-10-CM | POA: Diagnosis not present

## 2016-11-19 DIAGNOSIS — Z95828 Presence of other vascular implants and grafts: Secondary | ICD-10-CM | POA: Diagnosis not present

## 2016-11-19 DIAGNOSIS — Z9049 Acquired absence of other specified parts of digestive tract: Secondary | ICD-10-CM | POA: Diagnosis not present

## 2016-11-19 DIAGNOSIS — N2889 Other specified disorders of kidney and ureter: Secondary | ICD-10-CM | POA: Diagnosis not present

## 2016-11-19 DIAGNOSIS — R590 Localized enlarged lymph nodes: Secondary | ICD-10-CM | POA: Diagnosis not present

## 2016-11-19 DIAGNOSIS — Z90411 Acquired partial absence of pancreas: Secondary | ICD-10-CM | POA: Diagnosis not present

## 2016-11-19 DIAGNOSIS — M5144 Schmorl's nodes, thoracic region: Secondary | ICD-10-CM | POA: Diagnosis not present

## 2016-11-19 DIAGNOSIS — M488X9 Other specified spondylopathies, site unspecified: Secondary | ICD-10-CM | POA: Diagnosis not present

## 2016-11-19 DIAGNOSIS — Z8507 Personal history of malignant neoplasm of pancreas: Secondary | ICD-10-CM | POA: Diagnosis not present

## 2016-11-19 DIAGNOSIS — C162 Malignant neoplasm of body of stomach: Secondary | ICD-10-CM | POA: Diagnosis not present

## 2016-11-19 DIAGNOSIS — N200 Calculus of kidney: Secondary | ICD-10-CM | POA: Diagnosis not present

## 2016-11-19 DIAGNOSIS — I77811 Abdominal aortic ectasia: Secondary | ICD-10-CM | POA: Diagnosis not present

## 2016-11-19 DIAGNOSIS — K838 Other specified diseases of biliary tract: Secondary | ICD-10-CM | POA: Diagnosis not present

## 2016-11-19 DIAGNOSIS — I2510 Atherosclerotic heart disease of native coronary artery without angina pectoris: Secondary | ICD-10-CM | POA: Diagnosis not present

## 2016-11-19 DIAGNOSIS — J984 Other disorders of lung: Secondary | ICD-10-CM | POA: Diagnosis not present

## 2016-11-20 DIAGNOSIS — Z515 Encounter for palliative care: Secondary | ICD-10-CM | POA: Diagnosis not present

## 2016-11-20 DIAGNOSIS — C166 Malignant neoplasm of greater curvature of stomach, unspecified: Secondary | ICD-10-CM | POA: Diagnosis not present

## 2016-11-20 DIAGNOSIS — Z8507 Personal history of malignant neoplasm of pancreas: Secondary | ICD-10-CM | POA: Diagnosis not present

## 2016-11-20 DIAGNOSIS — D472 Monoclonal gammopathy: Secondary | ICD-10-CM | POA: Diagnosis not present

## 2016-11-22 DIAGNOSIS — C166 Malignant neoplasm of greater curvature of stomach, unspecified: Secondary | ICD-10-CM | POA: Diagnosis not present

## 2016-11-25 DIAGNOSIS — C166 Malignant neoplasm of greater curvature of stomach, unspecified: Secondary | ICD-10-CM | POA: Diagnosis not present

## 2016-11-26 DIAGNOSIS — C166 Malignant neoplasm of greater curvature of stomach, unspecified: Secondary | ICD-10-CM | POA: Diagnosis not present

## 2016-11-27 DIAGNOSIS — C166 Malignant neoplasm of greater curvature of stomach, unspecified: Secondary | ICD-10-CM | POA: Diagnosis not present

## 2016-11-28 DIAGNOSIS — C166 Malignant neoplasm of greater curvature of stomach, unspecified: Secondary | ICD-10-CM | POA: Diagnosis not present

## 2016-11-29 DIAGNOSIS — C166 Malignant neoplasm of greater curvature of stomach, unspecified: Secondary | ICD-10-CM | POA: Diagnosis not present

## 2016-12-03 DIAGNOSIS — C166 Malignant neoplasm of greater curvature of stomach, unspecified: Secondary | ICD-10-CM | POA: Diagnosis not present

## 2016-12-05 DIAGNOSIS — D472 Monoclonal gammopathy: Secondary | ICD-10-CM | POA: Diagnosis not present

## 2016-12-05 DIAGNOSIS — C166 Malignant neoplasm of greater curvature of stomach, unspecified: Secondary | ICD-10-CM | POA: Diagnosis not present

## 2016-12-10 DIAGNOSIS — C166 Malignant neoplasm of greater curvature of stomach, unspecified: Secondary | ICD-10-CM | POA: Diagnosis not present

## 2016-12-10 DIAGNOSIS — D500 Iron deficiency anemia secondary to blood loss (chronic): Secondary | ICD-10-CM | POA: Diagnosis not present

## 2016-12-10 DIAGNOSIS — Z8507 Personal history of malignant neoplasm of pancreas: Secondary | ICD-10-CM | POA: Diagnosis not present

## 2016-12-11 ENCOUNTER — Other Ambulatory Visit (HOSPITAL_COMMUNITY)
Admission: RE | Admit: 2016-12-11 | Discharge: 2016-12-11 | Disposition: A | Discharge status: Home / Self Care | Payer: Medicare Other | Source: Ambulatory Visit | Attending: Oncology | Admitting: Oncology

## 2016-12-11 DIAGNOSIS — Z800 Family history of malignant neoplasm of digestive organs: Secondary | ICD-10-CM | POA: Diagnosis not present

## 2016-12-11 DIAGNOSIS — Z807 Family history of other malignant neoplasms of lymphoid, hematopoietic and related tissues: Secondary | ICD-10-CM | POA: Diagnosis not present

## 2016-12-11 DIAGNOSIS — I080 Rheumatic disorders of both mitral and aortic valves: Secondary | ICD-10-CM | POA: Diagnosis not present

## 2016-12-11 DIAGNOSIS — E1122 Type 2 diabetes mellitus with diabetic chronic kidney disease: Secondary | ICD-10-CM | POA: Diagnosis not present

## 2016-12-11 DIAGNOSIS — N183 Chronic kidney disease, stage 3 (moderate): Secondary | ICD-10-CM | POA: Diagnosis not present

## 2016-12-11 DIAGNOSIS — Z951 Presence of aortocoronary bypass graft: Secondary | ICD-10-CM | POA: Diagnosis not present

## 2016-12-11 DIAGNOSIS — Z90410 Acquired total absence of pancreas: Secondary | ICD-10-CM | POA: Diagnosis not present

## 2016-12-11 DIAGNOSIS — I130 Hypertensive heart and chronic kidney disease with heart failure and stage 1 through stage 4 chronic kidney disease, or unspecified chronic kidney disease: Secondary | ICD-10-CM | POA: Diagnosis not present

## 2016-12-11 DIAGNOSIS — Z6822 Body mass index (BMI) 22.0-22.9, adult: Secondary | ICD-10-CM | POA: Diagnosis not present

## 2016-12-11 DIAGNOSIS — Z9081 Acquired absence of spleen: Secondary | ICD-10-CM | POA: Diagnosis not present

## 2016-12-11 DIAGNOSIS — C169 Malignant neoplasm of stomach, unspecified: Secondary | ICD-10-CM | POA: Diagnosis not present

## 2016-12-11 DIAGNOSIS — Z9221 Personal history of antineoplastic chemotherapy: Secondary | ICD-10-CM | POA: Diagnosis not present

## 2016-12-11 DIAGNOSIS — Z888 Allergy status to other drugs, medicaments and biological substances status: Secondary | ICD-10-CM | POA: Diagnosis not present

## 2016-12-11 DIAGNOSIS — Z01818 Encounter for other preprocedural examination: Secondary | ICD-10-CM | POA: Diagnosis not present

## 2016-12-11 DIAGNOSIS — C161 Malignant neoplasm of fundus of stomach: Secondary | ICD-10-CM | POA: Diagnosis not present

## 2016-12-11 DIAGNOSIS — E785 Hyperlipidemia, unspecified: Secondary | ICD-10-CM | POA: Diagnosis not present

## 2016-12-11 DIAGNOSIS — Z8052 Family history of malignant neoplasm of bladder: Secondary | ICD-10-CM | POA: Diagnosis not present

## 2016-12-11 DIAGNOSIS — I509 Heart failure, unspecified: Secondary | ICD-10-CM | POA: Diagnosis not present

## 2016-12-11 DIAGNOSIS — Z9049 Acquired absence of other specified parts of digestive tract: Secondary | ICD-10-CM | POA: Diagnosis not present

## 2016-12-11 DIAGNOSIS — Z87891 Personal history of nicotine dependence: Secondary | ICD-10-CM | POA: Diagnosis not present

## 2016-12-11 DIAGNOSIS — R531 Weakness: Secondary | ICD-10-CM | POA: Diagnosis not present

## 2016-12-11 DIAGNOSIS — D6852 Prothrombin gene mutation: Secondary | ICD-10-CM | POA: Diagnosis not present

## 2016-12-11 DIAGNOSIS — Z8507 Personal history of malignant neoplasm of pancreas: Secondary | ICD-10-CM | POA: Diagnosis not present

## 2016-12-13 DIAGNOSIS — C166 Malignant neoplasm of greater curvature of stomach, unspecified: Secondary | ICD-10-CM | POA: Diagnosis not present

## 2016-12-13 DIAGNOSIS — E088 Diabetes mellitus due to underlying condition with unspecified complications: Secondary | ICD-10-CM | POA: Diagnosis not present

## 2016-12-18 DIAGNOSIS — C166 Malignant neoplasm of greater curvature of stomach, unspecified: Secondary | ICD-10-CM | POA: Diagnosis not present

## 2016-12-23 DIAGNOSIS — C166 Malignant neoplasm of greater curvature of stomach, unspecified: Secondary | ICD-10-CM | POA: Diagnosis not present

## 2016-12-24 DIAGNOSIS — Z8249 Family history of ischemic heart disease and other diseases of the circulatory system: Secondary | ICD-10-CM | POA: Diagnosis not present

## 2016-12-24 DIAGNOSIS — Z951 Presence of aortocoronary bypass graft: Secondary | ICD-10-CM | POA: Diagnosis not present

## 2016-12-24 DIAGNOSIS — R339 Retention of urine, unspecified: Secondary | ICD-10-CM | POA: Diagnosis not present

## 2016-12-24 DIAGNOSIS — K2940 Chronic atrophic gastritis without bleeding: Secondary | ICD-10-CM | POA: Diagnosis not present

## 2016-12-24 DIAGNOSIS — I2510 Atherosclerotic heart disease of native coronary artery without angina pectoris: Secondary | ICD-10-CM | POA: Diagnosis not present

## 2016-12-24 DIAGNOSIS — C168 Malignant neoplasm of overlapping sites of stomach: Secondary | ICD-10-CM | POA: Diagnosis not present

## 2016-12-24 DIAGNOSIS — K449 Diaphragmatic hernia without obstruction or gangrene: Secondary | ICD-10-CM | POA: Diagnosis not present

## 2016-12-24 DIAGNOSIS — Z87891 Personal history of nicotine dependence: Secondary | ICD-10-CM | POA: Diagnosis not present

## 2016-12-24 DIAGNOSIS — D3A010 Benign carcinoid tumor of the duodenum: Secondary | ICD-10-CM | POA: Diagnosis not present

## 2016-12-24 DIAGNOSIS — G8918 Other acute postprocedural pain: Secondary | ICD-10-CM | POA: Diagnosis not present

## 2016-12-24 DIAGNOSIS — E1122 Type 2 diabetes mellitus with diabetic chronic kidney disease: Secondary | ICD-10-CM | POA: Diagnosis not present

## 2016-12-24 DIAGNOSIS — I110 Hypertensive heart disease with heart failure: Secondary | ICD-10-CM | POA: Diagnosis not present

## 2016-12-24 DIAGNOSIS — C162 Malignant neoplasm of body of stomach: Secondary | ICD-10-CM | POA: Diagnosis not present

## 2016-12-24 DIAGNOSIS — I5032 Chronic diastolic (congestive) heart failure: Secondary | ICD-10-CM | POA: Diagnosis not present

## 2016-12-24 DIAGNOSIS — Z853 Personal history of malignant neoplasm of breast: Secondary | ICD-10-CM | POA: Diagnosis not present

## 2016-12-24 DIAGNOSIS — N183 Chronic kidney disease, stage 3 (moderate): Secondary | ICD-10-CM | POA: Diagnosis not present

## 2016-12-24 DIAGNOSIS — I959 Hypotension, unspecified: Secondary | ICD-10-CM | POA: Diagnosis not present

## 2016-12-24 DIAGNOSIS — Z903 Acquired absence of stomach [part of]: Secondary | ICD-10-CM | POA: Diagnosis not present

## 2016-12-24 DIAGNOSIS — Z800 Family history of malignant neoplasm of digestive organs: Secondary | ICD-10-CM | POA: Diagnosis not present

## 2016-12-24 DIAGNOSIS — M129 Arthropathy, unspecified: Secondary | ICD-10-CM | POA: Diagnosis not present

## 2016-12-24 DIAGNOSIS — I130 Hypertensive heart and chronic kidney disease with heart failure and stage 1 through stage 4 chronic kidney disease, or unspecified chronic kidney disease: Secondary | ICD-10-CM | POA: Diagnosis not present

## 2016-12-24 DIAGNOSIS — Z803 Family history of malignant neoplasm of breast: Secondary | ICD-10-CM | POA: Diagnosis not present

## 2016-12-24 DIAGNOSIS — C169 Malignant neoplasm of stomach, unspecified: Secondary | ICD-10-CM | POA: Diagnosis not present

## 2016-12-24 DIAGNOSIS — R109 Unspecified abdominal pain: Secondary | ICD-10-CM | POA: Diagnosis not present

## 2016-12-24 DIAGNOSIS — I080 Rheumatic disorders of both mitral and aortic valves: Secondary | ICD-10-CM | POA: Diagnosis not present

## 2016-12-24 DIAGNOSIS — R410 Disorientation, unspecified: Secondary | ICD-10-CM | POA: Diagnosis not present

## 2016-12-31 ENCOUNTER — Encounter (HOSPITAL_COMMUNITY): Payer: Self-pay

## 2017-01-01 DIAGNOSIS — Z8249 Family history of ischemic heart disease and other diseases of the circulatory system: Secondary | ICD-10-CM | POA: Diagnosis not present

## 2017-01-01 DIAGNOSIS — D509 Iron deficiency anemia, unspecified: Secondary | ICD-10-CM | POA: Diagnosis not present

## 2017-01-01 DIAGNOSIS — I2510 Atherosclerotic heart disease of native coronary artery without angina pectoris: Secondary | ICD-10-CM | POA: Diagnosis not present

## 2017-01-01 DIAGNOSIS — I517 Cardiomegaly: Secondary | ICD-10-CM | POA: Diagnosis not present

## 2017-01-01 DIAGNOSIS — R05 Cough: Secondary | ICD-10-CM | POA: Diagnosis not present

## 2017-01-01 DIAGNOSIS — M129 Arthropathy, unspecified: Secondary | ICD-10-CM | POA: Diagnosis not present

## 2017-01-01 DIAGNOSIS — I340 Nonrheumatic mitral (valve) insufficiency: Secondary | ICD-10-CM | POA: Diagnosis not present

## 2017-01-01 DIAGNOSIS — Z853 Personal history of malignant neoplasm of breast: Secondary | ICD-10-CM | POA: Diagnosis not present

## 2017-01-01 DIAGNOSIS — R710 Precipitous drop in hematocrit: Secondary | ICD-10-CM | POA: Diagnosis not present

## 2017-01-01 DIAGNOSIS — I080 Rheumatic disorders of both mitral and aortic valves: Secondary | ICD-10-CM | POA: Diagnosis not present

## 2017-01-01 DIAGNOSIS — R918 Other nonspecific abnormal finding of lung field: Secondary | ICD-10-CM | POA: Diagnosis not present

## 2017-01-01 DIAGNOSIS — E785 Hyperlipidemia, unspecified: Secondary | ICD-10-CM | POA: Diagnosis not present

## 2017-01-01 DIAGNOSIS — I214 Non-ST elevation (NSTEMI) myocardial infarction: Secondary | ICD-10-CM | POA: Diagnosis not present

## 2017-01-01 DIAGNOSIS — I351 Nonrheumatic aortic (valve) insufficiency: Secondary | ICD-10-CM | POA: Diagnosis not present

## 2017-01-01 DIAGNOSIS — R509 Fever, unspecified: Secondary | ICD-10-CM | POA: Diagnosis not present

## 2017-01-01 DIAGNOSIS — J690 Pneumonitis due to inhalation of food and vomit: Secondary | ICD-10-CM | POA: Diagnosis not present

## 2017-01-01 DIAGNOSIS — Z951 Presence of aortocoronary bypass graft: Secondary | ICD-10-CM | POA: Diagnosis not present

## 2017-01-01 DIAGNOSIS — I252 Old myocardial infarction: Secondary | ICD-10-CM | POA: Diagnosis not present

## 2017-01-01 DIAGNOSIS — I058 Other rheumatic mitral valve diseases: Secondary | ICD-10-CM | POA: Diagnosis not present

## 2017-01-01 DIAGNOSIS — R001 Bradycardia, unspecified: Secondary | ICD-10-CM | POA: Diagnosis not present

## 2017-01-01 DIAGNOSIS — Z8739 Personal history of other diseases of the musculoskeletal system and connective tissue: Secondary | ICD-10-CM | POA: Diagnosis not present

## 2017-01-01 DIAGNOSIS — I700 Atherosclerosis of aorta: Secondary | ICD-10-CM | POA: Diagnosis not present

## 2017-01-01 DIAGNOSIS — I129 Hypertensive chronic kidney disease with stage 1 through stage 4 chronic kidney disease, or unspecified chronic kidney disease: Secondary | ICD-10-CM | POA: Diagnosis not present

## 2017-01-01 DIAGNOSIS — I77810 Thoracic aortic ectasia: Secondary | ICD-10-CM | POA: Diagnosis not present

## 2017-01-01 DIAGNOSIS — Z9884 Bariatric surgery status: Secondary | ICD-10-CM | POA: Diagnosis not present

## 2017-01-01 DIAGNOSIS — C161 Malignant neoplasm of fundus of stomach: Secondary | ICD-10-CM | POA: Diagnosis not present

## 2017-01-01 DIAGNOSIS — N183 Chronic kidney disease, stage 3 (moderate): Secondary | ICD-10-CM | POA: Diagnosis not present

## 2017-01-01 DIAGNOSIS — I459 Conduction disorder, unspecified: Secondary | ICD-10-CM | POA: Diagnosis not present

## 2017-01-01 DIAGNOSIS — K209 Esophagitis, unspecified: Secondary | ICD-10-CM | POA: Diagnosis not present

## 2017-01-01 DIAGNOSIS — C169 Malignant neoplasm of stomach, unspecified: Secondary | ICD-10-CM | POA: Diagnosis not present

## 2017-01-01 DIAGNOSIS — R5082 Postprocedural fever: Secondary | ICD-10-CM | POA: Diagnosis not present

## 2017-01-01 DIAGNOSIS — E1122 Type 2 diabetes mellitus with diabetic chronic kidney disease: Secondary | ICD-10-CM | POA: Diagnosis not present

## 2017-01-01 DIAGNOSIS — Z9889 Other specified postprocedural states: Secondary | ICD-10-CM | POA: Diagnosis not present

## 2017-01-07 DIAGNOSIS — C169 Malignant neoplasm of stomach, unspecified: Secondary | ICD-10-CM | POA: Diagnosis not present

## 2017-01-07 DIAGNOSIS — Z951 Presence of aortocoronary bypass graft: Secondary | ICD-10-CM | POA: Diagnosis not present

## 2017-01-07 DIAGNOSIS — Z7984 Long term (current) use of oral hypoglycemic drugs: Secondary | ICD-10-CM | POA: Diagnosis not present

## 2017-01-07 DIAGNOSIS — Z431 Encounter for attention to gastrostomy: Secondary | ICD-10-CM | POA: Diagnosis not present

## 2017-01-07 DIAGNOSIS — E119 Type 2 diabetes mellitus without complications: Secondary | ICD-10-CM | POA: Diagnosis not present

## 2017-01-07 DIAGNOSIS — N183 Chronic kidney disease, stage 3 (moderate): Secondary | ICD-10-CM | POA: Diagnosis not present

## 2017-01-07 DIAGNOSIS — D630 Anemia in neoplastic disease: Secondary | ICD-10-CM | POA: Diagnosis not present

## 2017-01-07 DIAGNOSIS — I1310 Hypertensive heart and chronic kidney disease without heart failure, with stage 1 through stage 4 chronic kidney disease, or unspecified chronic kidney disease: Secondary | ICD-10-CM | POA: Diagnosis not present

## 2017-01-07 DIAGNOSIS — I214 Non-ST elevation (NSTEMI) myocardial infarction: Secondary | ICD-10-CM | POA: Diagnosis not present

## 2017-01-07 DIAGNOSIS — Z483 Aftercare following surgery for neoplasm: Secondary | ICD-10-CM | POA: Diagnosis not present

## 2017-01-07 DIAGNOSIS — I2510 Atherosclerotic heart disease of native coronary artery without angina pectoris: Secondary | ICD-10-CM | POA: Diagnosis not present

## 2017-01-07 DIAGNOSIS — M109 Gout, unspecified: Secondary | ICD-10-CM | POA: Diagnosis not present

## 2017-01-08 DIAGNOSIS — I110 Hypertensive heart disease with heart failure: Secondary | ICD-10-CM | POA: Diagnosis not present

## 2017-01-08 DIAGNOSIS — E785 Hyperlipidemia, unspecified: Secondary | ICD-10-CM | POA: Diagnosis not present

## 2017-01-08 DIAGNOSIS — I214 Non-ST elevation (NSTEMI) myocardial infarction: Secondary | ICD-10-CM | POA: Diagnosis not present

## 2017-01-08 DIAGNOSIS — Z6820 Body mass index (BMI) 20.0-20.9, adult: Secondary | ICD-10-CM | POA: Diagnosis not present

## 2017-01-08 DIAGNOSIS — I5032 Chronic diastolic (congestive) heart failure: Secondary | ICD-10-CM | POA: Diagnosis not present

## 2017-01-10 DIAGNOSIS — C166 Malignant neoplasm of greater curvature of stomach, unspecified: Secondary | ICD-10-CM | POA: Diagnosis not present

## 2017-01-13 DIAGNOSIS — D630 Anemia in neoplastic disease: Secondary | ICD-10-CM | POA: Diagnosis not present

## 2017-01-13 DIAGNOSIS — Z951 Presence of aortocoronary bypass graft: Secondary | ICD-10-CM | POA: Diagnosis not present

## 2017-01-13 DIAGNOSIS — Z7984 Long term (current) use of oral hypoglycemic drugs: Secondary | ICD-10-CM | POA: Diagnosis not present

## 2017-01-13 DIAGNOSIS — N183 Chronic kidney disease, stage 3 (moderate): Secondary | ICD-10-CM | POA: Diagnosis not present

## 2017-01-13 DIAGNOSIS — E119 Type 2 diabetes mellitus without complications: Secondary | ICD-10-CM | POA: Diagnosis not present

## 2017-01-13 DIAGNOSIS — Z483 Aftercare following surgery for neoplasm: Secondary | ICD-10-CM | POA: Diagnosis not present

## 2017-01-13 DIAGNOSIS — M109 Gout, unspecified: Secondary | ICD-10-CM | POA: Diagnosis not present

## 2017-01-13 DIAGNOSIS — I214 Non-ST elevation (NSTEMI) myocardial infarction: Secondary | ICD-10-CM | POA: Diagnosis not present

## 2017-01-13 DIAGNOSIS — Z431 Encounter for attention to gastrostomy: Secondary | ICD-10-CM | POA: Diagnosis not present

## 2017-01-13 DIAGNOSIS — I1310 Hypertensive heart and chronic kidney disease without heart failure, with stage 1 through stage 4 chronic kidney disease, or unspecified chronic kidney disease: Secondary | ICD-10-CM | POA: Diagnosis not present

## 2017-01-13 DIAGNOSIS — C169 Malignant neoplasm of stomach, unspecified: Secondary | ICD-10-CM | POA: Diagnosis not present

## 2017-01-13 DIAGNOSIS — I2510 Atherosclerotic heart disease of native coronary artery without angina pectoris: Secondary | ICD-10-CM | POA: Diagnosis not present

## 2017-01-14 DIAGNOSIS — C166 Malignant neoplasm of greater curvature of stomach, unspecified: Secondary | ICD-10-CM | POA: Diagnosis not present

## 2017-01-14 DIAGNOSIS — E441 Mild protein-calorie malnutrition: Secondary | ICD-10-CM | POA: Diagnosis not present

## 2017-01-14 DIAGNOSIS — Z931 Gastrostomy status: Secondary | ICD-10-CM | POA: Diagnosis not present

## 2017-01-15 DIAGNOSIS — C161 Malignant neoplasm of fundus of stomach: Secondary | ICD-10-CM | POA: Diagnosis not present

## 2017-01-16 DIAGNOSIS — D630 Anemia in neoplastic disease: Secondary | ICD-10-CM | POA: Diagnosis not present

## 2017-01-16 DIAGNOSIS — M109 Gout, unspecified: Secondary | ICD-10-CM | POA: Diagnosis not present

## 2017-01-16 DIAGNOSIS — E119 Type 2 diabetes mellitus without complications: Secondary | ICD-10-CM | POA: Diagnosis not present

## 2017-01-16 DIAGNOSIS — Z951 Presence of aortocoronary bypass graft: Secondary | ICD-10-CM | POA: Diagnosis not present

## 2017-01-16 DIAGNOSIS — N183 Chronic kidney disease, stage 3 (moderate): Secondary | ICD-10-CM | POA: Diagnosis not present

## 2017-01-16 DIAGNOSIS — I2510 Atherosclerotic heart disease of native coronary artery without angina pectoris: Secondary | ICD-10-CM | POA: Diagnosis not present

## 2017-01-16 DIAGNOSIS — I214 Non-ST elevation (NSTEMI) myocardial infarction: Secondary | ICD-10-CM | POA: Diagnosis not present

## 2017-01-16 DIAGNOSIS — Z431 Encounter for attention to gastrostomy: Secondary | ICD-10-CM | POA: Diagnosis not present

## 2017-01-16 DIAGNOSIS — C169 Malignant neoplasm of stomach, unspecified: Secondary | ICD-10-CM | POA: Diagnosis not present

## 2017-01-16 DIAGNOSIS — I1310 Hypertensive heart and chronic kidney disease without heart failure, with stage 1 through stage 4 chronic kidney disease, or unspecified chronic kidney disease: Secondary | ICD-10-CM | POA: Diagnosis not present

## 2017-01-16 DIAGNOSIS — Z7984 Long term (current) use of oral hypoglycemic drugs: Secondary | ICD-10-CM | POA: Diagnosis not present

## 2017-01-16 DIAGNOSIS — Z483 Aftercare following surgery for neoplasm: Secondary | ICD-10-CM | POA: Diagnosis not present

## 2017-01-17 DIAGNOSIS — C166 Malignant neoplasm of greater curvature of stomach, unspecified: Secondary | ICD-10-CM | POA: Diagnosis not present

## 2017-01-20 DIAGNOSIS — C169 Malignant neoplasm of stomach, unspecified: Secondary | ICD-10-CM | POA: Diagnosis not present

## 2017-01-20 DIAGNOSIS — E119 Type 2 diabetes mellitus without complications: Secondary | ICD-10-CM | POA: Diagnosis not present

## 2017-01-20 DIAGNOSIS — I1310 Hypertensive heart and chronic kidney disease without heart failure, with stage 1 through stage 4 chronic kidney disease, or unspecified chronic kidney disease: Secondary | ICD-10-CM | POA: Diagnosis not present

## 2017-01-20 DIAGNOSIS — I2510 Atherosclerotic heart disease of native coronary artery without angina pectoris: Secondary | ICD-10-CM | POA: Diagnosis not present

## 2017-01-20 DIAGNOSIS — N183 Chronic kidney disease, stage 3 (moderate): Secondary | ICD-10-CM | POA: Diagnosis not present

## 2017-01-20 DIAGNOSIS — Z7984 Long term (current) use of oral hypoglycemic drugs: Secondary | ICD-10-CM | POA: Diagnosis not present

## 2017-01-20 DIAGNOSIS — I214 Non-ST elevation (NSTEMI) myocardial infarction: Secondary | ICD-10-CM | POA: Diagnosis not present

## 2017-01-20 DIAGNOSIS — D630 Anemia in neoplastic disease: Secondary | ICD-10-CM | POA: Diagnosis not present

## 2017-01-20 DIAGNOSIS — Z431 Encounter for attention to gastrostomy: Secondary | ICD-10-CM | POA: Diagnosis not present

## 2017-01-20 DIAGNOSIS — Z951 Presence of aortocoronary bypass graft: Secondary | ICD-10-CM | POA: Diagnosis not present

## 2017-01-20 DIAGNOSIS — Z483 Aftercare following surgery for neoplasm: Secondary | ICD-10-CM | POA: Diagnosis not present

## 2017-01-20 DIAGNOSIS — M109 Gout, unspecified: Secondary | ICD-10-CM | POA: Diagnosis not present

## 2017-01-21 DIAGNOSIS — E860 Dehydration: Secondary | ICD-10-CM | POA: Diagnosis not present

## 2017-01-21 DIAGNOSIS — C252 Malignant neoplasm of tail of pancreas: Secondary | ICD-10-CM | POA: Diagnosis not present

## 2017-01-21 DIAGNOSIS — C166 Malignant neoplasm of greater curvature of stomach, unspecified: Secondary | ICD-10-CM | POA: Diagnosis not present

## 2017-01-22 DIAGNOSIS — I110 Hypertensive heart disease with heart failure: Secondary | ICD-10-CM | POA: Diagnosis not present

## 2017-01-22 DIAGNOSIS — Z09 Encounter for follow-up examination after completed treatment for conditions other than malignant neoplasm: Secondary | ICD-10-CM | POA: Diagnosis not present

## 2017-01-22 DIAGNOSIS — C169 Malignant neoplasm of stomach, unspecified: Secondary | ICD-10-CM | POA: Diagnosis not present

## 2017-01-22 DIAGNOSIS — C161 Malignant neoplasm of fundus of stomach: Secondary | ICD-10-CM | POA: Diagnosis not present

## 2017-01-23 DIAGNOSIS — I1310 Hypertensive heart and chronic kidney disease without heart failure, with stage 1 through stage 4 chronic kidney disease, or unspecified chronic kidney disease: Secondary | ICD-10-CM | POA: Diagnosis not present

## 2017-01-23 DIAGNOSIS — N183 Chronic kidney disease, stage 3 (moderate): Secondary | ICD-10-CM | POA: Diagnosis not present

## 2017-01-23 DIAGNOSIS — E119 Type 2 diabetes mellitus without complications: Secondary | ICD-10-CM | POA: Diagnosis not present

## 2017-01-23 DIAGNOSIS — I214 Non-ST elevation (NSTEMI) myocardial infarction: Secondary | ICD-10-CM | POA: Diagnosis not present

## 2017-01-23 DIAGNOSIS — I2510 Atherosclerotic heart disease of native coronary artery without angina pectoris: Secondary | ICD-10-CM | POA: Diagnosis not present

## 2017-01-23 DIAGNOSIS — D630 Anemia in neoplastic disease: Secondary | ICD-10-CM | POA: Diagnosis not present

## 2017-01-23 DIAGNOSIS — Z431 Encounter for attention to gastrostomy: Secondary | ICD-10-CM | POA: Diagnosis not present

## 2017-01-23 DIAGNOSIS — C169 Malignant neoplasm of stomach, unspecified: Secondary | ICD-10-CM | POA: Diagnosis not present

## 2017-01-23 DIAGNOSIS — M109 Gout, unspecified: Secondary | ICD-10-CM | POA: Diagnosis not present

## 2017-01-23 DIAGNOSIS — Z483 Aftercare following surgery for neoplasm: Secondary | ICD-10-CM | POA: Diagnosis not present

## 2017-01-23 DIAGNOSIS — Z951 Presence of aortocoronary bypass graft: Secondary | ICD-10-CM | POA: Diagnosis not present

## 2017-01-23 DIAGNOSIS — Z7984 Long term (current) use of oral hypoglycemic drugs: Secondary | ICD-10-CM | POA: Diagnosis not present

## 2017-01-24 DIAGNOSIS — C166 Malignant neoplasm of greater curvature of stomach, unspecified: Secondary | ICD-10-CM | POA: Diagnosis not present

## 2017-01-24 DIAGNOSIS — Z8507 Personal history of malignant neoplasm of pancreas: Secondary | ICD-10-CM | POA: Diagnosis not present

## 2017-01-27 DIAGNOSIS — N183 Chronic kidney disease, stage 3 (moderate): Secondary | ICD-10-CM | POA: Diagnosis not present

## 2017-01-27 DIAGNOSIS — Z483 Aftercare following surgery for neoplasm: Secondary | ICD-10-CM | POA: Diagnosis not present

## 2017-01-27 DIAGNOSIS — C169 Malignant neoplasm of stomach, unspecified: Secondary | ICD-10-CM | POA: Diagnosis not present

## 2017-01-27 DIAGNOSIS — M109 Gout, unspecified: Secondary | ICD-10-CM | POA: Diagnosis not present

## 2017-01-27 DIAGNOSIS — I1310 Hypertensive heart and chronic kidney disease without heart failure, with stage 1 through stage 4 chronic kidney disease, or unspecified chronic kidney disease: Secondary | ICD-10-CM | POA: Diagnosis not present

## 2017-01-27 DIAGNOSIS — D630 Anemia in neoplastic disease: Secondary | ICD-10-CM | POA: Diagnosis not present

## 2017-01-27 DIAGNOSIS — I214 Non-ST elevation (NSTEMI) myocardial infarction: Secondary | ICD-10-CM | POA: Diagnosis not present

## 2017-01-27 DIAGNOSIS — I2510 Atherosclerotic heart disease of native coronary artery without angina pectoris: Secondary | ICD-10-CM | POA: Diagnosis not present

## 2017-01-27 DIAGNOSIS — Z951 Presence of aortocoronary bypass graft: Secondary | ICD-10-CM | POA: Diagnosis not present

## 2017-01-27 DIAGNOSIS — Z7984 Long term (current) use of oral hypoglycemic drugs: Secondary | ICD-10-CM | POA: Diagnosis not present

## 2017-01-27 DIAGNOSIS — E119 Type 2 diabetes mellitus without complications: Secondary | ICD-10-CM | POA: Diagnosis not present

## 2017-01-27 DIAGNOSIS — Z431 Encounter for attention to gastrostomy: Secondary | ICD-10-CM | POA: Diagnosis not present

## 2017-01-28 DIAGNOSIS — R197 Diarrhea, unspecified: Secondary | ICD-10-CM

## 2017-01-28 DIAGNOSIS — Z7984 Long term (current) use of oral hypoglycemic drugs: Secondary | ICD-10-CM | POA: Diagnosis not present

## 2017-01-28 DIAGNOSIS — D72829 Elevated white blood cell count, unspecified: Secondary | ICD-10-CM

## 2017-01-28 DIAGNOSIS — I1310 Hypertensive heart and chronic kidney disease without heart failure, with stage 1 through stage 4 chronic kidney disease, or unspecified chronic kidney disease: Secondary | ICD-10-CM | POA: Diagnosis not present

## 2017-01-28 DIAGNOSIS — Z483 Aftercare following surgery for neoplasm: Secondary | ICD-10-CM | POA: Diagnosis not present

## 2017-01-28 DIAGNOSIS — D473 Essential (hemorrhagic) thrombocythemia: Secondary | ICD-10-CM

## 2017-01-28 DIAGNOSIS — E860 Dehydration: Secondary | ICD-10-CM | POA: Diagnosis not present

## 2017-01-28 DIAGNOSIS — Z431 Encounter for attention to gastrostomy: Secondary | ICD-10-CM | POA: Diagnosis not present

## 2017-01-28 DIAGNOSIS — N189 Chronic kidney disease, unspecified: Secondary | ICD-10-CM

## 2017-01-28 DIAGNOSIS — I214 Non-ST elevation (NSTEMI) myocardial infarction: Secondary | ICD-10-CM | POA: Diagnosis not present

## 2017-01-28 DIAGNOSIS — M109 Gout, unspecified: Secondary | ICD-10-CM | POA: Diagnosis not present

## 2017-01-28 DIAGNOSIS — Z8507 Personal history of malignant neoplasm of pancreas: Secondary | ICD-10-CM | POA: Diagnosis not present

## 2017-01-28 DIAGNOSIS — D500 Iron deficiency anemia secondary to blood loss (chronic): Secondary | ICD-10-CM | POA: Diagnosis not present

## 2017-01-28 DIAGNOSIS — D630 Anemia in neoplastic disease: Secondary | ICD-10-CM | POA: Diagnosis not present

## 2017-01-28 DIAGNOSIS — E119 Type 2 diabetes mellitus without complications: Secondary | ICD-10-CM | POA: Diagnosis not present

## 2017-01-28 DIAGNOSIS — I2510 Atherosclerotic heart disease of native coronary artery without angina pectoris: Secondary | ICD-10-CM | POA: Diagnosis not present

## 2017-01-28 DIAGNOSIS — Z85020 Personal history of malignant carcinoid tumor of stomach: Secondary | ICD-10-CM | POA: Diagnosis not present

## 2017-01-28 DIAGNOSIS — N183 Chronic kidney disease, stage 3 (moderate): Secondary | ICD-10-CM | POA: Diagnosis not present

## 2017-01-28 DIAGNOSIS — C166 Malignant neoplasm of greater curvature of stomach, unspecified: Secondary | ICD-10-CM | POA: Diagnosis not present

## 2017-01-28 DIAGNOSIS — Z951 Presence of aortocoronary bypass graft: Secondary | ICD-10-CM | POA: Diagnosis not present

## 2017-01-28 DIAGNOSIS — C169 Malignant neoplasm of stomach, unspecified: Secondary | ICD-10-CM | POA: Diagnosis not present

## 2017-01-30 DIAGNOSIS — C169 Malignant neoplasm of stomach, unspecified: Secondary | ICD-10-CM | POA: Diagnosis not present

## 2017-01-30 DIAGNOSIS — E119 Type 2 diabetes mellitus without complications: Secondary | ICD-10-CM | POA: Diagnosis not present

## 2017-01-30 DIAGNOSIS — I1310 Hypertensive heart and chronic kidney disease without heart failure, with stage 1 through stage 4 chronic kidney disease, or unspecified chronic kidney disease: Secondary | ICD-10-CM | POA: Diagnosis not present

## 2017-01-30 DIAGNOSIS — Z431 Encounter for attention to gastrostomy: Secondary | ICD-10-CM | POA: Diagnosis not present

## 2017-01-30 DIAGNOSIS — M109 Gout, unspecified: Secondary | ICD-10-CM | POA: Diagnosis not present

## 2017-01-30 DIAGNOSIS — Z951 Presence of aortocoronary bypass graft: Secondary | ICD-10-CM | POA: Diagnosis not present

## 2017-01-30 DIAGNOSIS — I2510 Atherosclerotic heart disease of native coronary artery without angina pectoris: Secondary | ICD-10-CM | POA: Diagnosis not present

## 2017-01-30 DIAGNOSIS — I214 Non-ST elevation (NSTEMI) myocardial infarction: Secondary | ICD-10-CM | POA: Diagnosis not present

## 2017-01-30 DIAGNOSIS — Z7984 Long term (current) use of oral hypoglycemic drugs: Secondary | ICD-10-CM | POA: Diagnosis not present

## 2017-01-30 DIAGNOSIS — N183 Chronic kidney disease, stage 3 (moderate): Secondary | ICD-10-CM | POA: Diagnosis not present

## 2017-01-30 DIAGNOSIS — D630 Anemia in neoplastic disease: Secondary | ICD-10-CM | POA: Diagnosis not present

## 2017-01-30 DIAGNOSIS — Z483 Aftercare following surgery for neoplasm: Secondary | ICD-10-CM | POA: Diagnosis not present

## 2017-01-31 DIAGNOSIS — I110 Hypertensive heart disease with heart failure: Secondary | ICD-10-CM | POA: Diagnosis not present

## 2017-01-31 DIAGNOSIS — C169 Malignant neoplasm of stomach, unspecified: Secondary | ICD-10-CM | POA: Diagnosis not present

## 2017-01-31 DIAGNOSIS — C166 Malignant neoplasm of greater curvature of stomach, unspecified: Secondary | ICD-10-CM | POA: Diagnosis not present

## 2017-02-03 DIAGNOSIS — N183 Chronic kidney disease, stage 3 (moderate): Secondary | ICD-10-CM | POA: Diagnosis not present

## 2017-02-03 DIAGNOSIS — I1310 Hypertensive heart and chronic kidney disease without heart failure, with stage 1 through stage 4 chronic kidney disease, or unspecified chronic kidney disease: Secondary | ICD-10-CM | POA: Diagnosis not present

## 2017-02-03 DIAGNOSIS — Z483 Aftercare following surgery for neoplasm: Secondary | ICD-10-CM | POA: Diagnosis not present

## 2017-02-03 DIAGNOSIS — Z431 Encounter for attention to gastrostomy: Secondary | ICD-10-CM | POA: Diagnosis not present

## 2017-02-03 DIAGNOSIS — C169 Malignant neoplasm of stomach, unspecified: Secondary | ICD-10-CM | POA: Diagnosis not present

## 2017-02-03 DIAGNOSIS — I2510 Atherosclerotic heart disease of native coronary artery without angina pectoris: Secondary | ICD-10-CM | POA: Diagnosis not present

## 2017-02-03 DIAGNOSIS — E119 Type 2 diabetes mellitus without complications: Secondary | ICD-10-CM | POA: Diagnosis not present

## 2017-02-03 DIAGNOSIS — D630 Anemia in neoplastic disease: Secondary | ICD-10-CM | POA: Diagnosis not present

## 2017-02-03 DIAGNOSIS — Z7984 Long term (current) use of oral hypoglycemic drugs: Secondary | ICD-10-CM | POA: Diagnosis not present

## 2017-02-03 DIAGNOSIS — Z951 Presence of aortocoronary bypass graft: Secondary | ICD-10-CM | POA: Diagnosis not present

## 2017-02-03 DIAGNOSIS — I214 Non-ST elevation (NSTEMI) myocardial infarction: Secondary | ICD-10-CM | POA: Diagnosis not present

## 2017-02-03 DIAGNOSIS — M109 Gout, unspecified: Secondary | ICD-10-CM | POA: Diagnosis not present

## 2017-02-04 DIAGNOSIS — C166 Malignant neoplasm of greater curvature of stomach, unspecified: Secondary | ICD-10-CM | POA: Diagnosis not present

## 2017-02-05 DIAGNOSIS — I2510 Atherosclerotic heart disease of native coronary artery without angina pectoris: Secondary | ICD-10-CM | POA: Diagnosis not present

## 2017-02-05 DIAGNOSIS — M109 Gout, unspecified: Secondary | ICD-10-CM | POA: Diagnosis not present

## 2017-02-05 DIAGNOSIS — I1310 Hypertensive heart and chronic kidney disease without heart failure, with stage 1 through stage 4 chronic kidney disease, or unspecified chronic kidney disease: Secondary | ICD-10-CM | POA: Diagnosis not present

## 2017-02-05 DIAGNOSIS — D630 Anemia in neoplastic disease: Secondary | ICD-10-CM | POA: Diagnosis not present

## 2017-02-05 DIAGNOSIS — Z483 Aftercare following surgery for neoplasm: Secondary | ICD-10-CM | POA: Diagnosis not present

## 2017-02-05 DIAGNOSIS — I214 Non-ST elevation (NSTEMI) myocardial infarction: Secondary | ICD-10-CM | POA: Diagnosis not present

## 2017-02-05 DIAGNOSIS — Z951 Presence of aortocoronary bypass graft: Secondary | ICD-10-CM | POA: Diagnosis not present

## 2017-02-05 DIAGNOSIS — Z431 Encounter for attention to gastrostomy: Secondary | ICD-10-CM | POA: Diagnosis not present

## 2017-02-05 DIAGNOSIS — C169 Malignant neoplasm of stomach, unspecified: Secondary | ICD-10-CM | POA: Diagnosis not present

## 2017-02-05 DIAGNOSIS — N183 Chronic kidney disease, stage 3 (moderate): Secondary | ICD-10-CM | POA: Diagnosis not present

## 2017-02-05 DIAGNOSIS — Z7984 Long term (current) use of oral hypoglycemic drugs: Secondary | ICD-10-CM | POA: Diagnosis not present

## 2017-02-05 DIAGNOSIS — E119 Type 2 diabetes mellitus without complications: Secondary | ICD-10-CM | POA: Diagnosis not present

## 2017-02-06 DIAGNOSIS — E119 Type 2 diabetes mellitus without complications: Secondary | ICD-10-CM | POA: Diagnosis not present

## 2017-02-06 DIAGNOSIS — C169 Malignant neoplasm of stomach, unspecified: Secondary | ICD-10-CM | POA: Diagnosis not present

## 2017-02-06 DIAGNOSIS — D630 Anemia in neoplastic disease: Secondary | ICD-10-CM | POA: Diagnosis not present

## 2017-02-06 DIAGNOSIS — Z951 Presence of aortocoronary bypass graft: Secondary | ICD-10-CM | POA: Diagnosis not present

## 2017-02-06 DIAGNOSIS — Z483 Aftercare following surgery for neoplasm: Secondary | ICD-10-CM | POA: Diagnosis not present

## 2017-02-06 DIAGNOSIS — Z7984 Long term (current) use of oral hypoglycemic drugs: Secondary | ICD-10-CM | POA: Diagnosis not present

## 2017-02-06 DIAGNOSIS — Z431 Encounter for attention to gastrostomy: Secondary | ICD-10-CM | POA: Diagnosis not present

## 2017-02-06 DIAGNOSIS — I2510 Atherosclerotic heart disease of native coronary artery without angina pectoris: Secondary | ICD-10-CM | POA: Diagnosis not present

## 2017-02-06 DIAGNOSIS — I1310 Hypertensive heart and chronic kidney disease without heart failure, with stage 1 through stage 4 chronic kidney disease, or unspecified chronic kidney disease: Secondary | ICD-10-CM | POA: Diagnosis not present

## 2017-02-06 DIAGNOSIS — N183 Chronic kidney disease, stage 3 (moderate): Secondary | ICD-10-CM | POA: Diagnosis not present

## 2017-02-06 DIAGNOSIS — M109 Gout, unspecified: Secondary | ICD-10-CM | POA: Diagnosis not present

## 2017-02-06 DIAGNOSIS — I214 Non-ST elevation (NSTEMI) myocardial infarction: Secondary | ICD-10-CM | POA: Diagnosis not present

## 2017-02-07 DIAGNOSIS — R197 Diarrhea, unspecified: Secondary | ICD-10-CM | POA: Diagnosis not present

## 2017-02-07 DIAGNOSIS — Z8507 Personal history of malignant neoplasm of pancreas: Secondary | ICD-10-CM | POA: Diagnosis not present

## 2017-02-07 DIAGNOSIS — E860 Dehydration: Secondary | ICD-10-CM | POA: Diagnosis not present

## 2017-02-07 DIAGNOSIS — C166 Malignant neoplasm of greater curvature of stomach, unspecified: Secondary | ICD-10-CM | POA: Diagnosis not present

## 2017-02-07 DIAGNOSIS — D72829 Elevated white blood cell count, unspecified: Secondary | ICD-10-CM | POA: Diagnosis not present

## 2017-02-10 DIAGNOSIS — Z951 Presence of aortocoronary bypass graft: Secondary | ICD-10-CM | POA: Diagnosis not present

## 2017-02-10 DIAGNOSIS — I1310 Hypertensive heart and chronic kidney disease without heart failure, with stage 1 through stage 4 chronic kidney disease, or unspecified chronic kidney disease: Secondary | ICD-10-CM | POA: Diagnosis not present

## 2017-02-10 DIAGNOSIS — Z431 Encounter for attention to gastrostomy: Secondary | ICD-10-CM | POA: Diagnosis not present

## 2017-02-10 DIAGNOSIS — M109 Gout, unspecified: Secondary | ICD-10-CM | POA: Diagnosis not present

## 2017-02-10 DIAGNOSIS — Z7984 Long term (current) use of oral hypoglycemic drugs: Secondary | ICD-10-CM | POA: Diagnosis not present

## 2017-02-10 DIAGNOSIS — N183 Chronic kidney disease, stage 3 (moderate): Secondary | ICD-10-CM | POA: Diagnosis not present

## 2017-02-10 DIAGNOSIS — E119 Type 2 diabetes mellitus without complications: Secondary | ICD-10-CM | POA: Diagnosis not present

## 2017-02-10 DIAGNOSIS — D630 Anemia in neoplastic disease: Secondary | ICD-10-CM | POA: Diagnosis not present

## 2017-02-10 DIAGNOSIS — Z483 Aftercare following surgery for neoplasm: Secondary | ICD-10-CM | POA: Diagnosis not present

## 2017-02-10 DIAGNOSIS — I214 Non-ST elevation (NSTEMI) myocardial infarction: Secondary | ICD-10-CM | POA: Diagnosis not present

## 2017-02-10 DIAGNOSIS — I2510 Atherosclerotic heart disease of native coronary artery without angina pectoris: Secondary | ICD-10-CM | POA: Diagnosis not present

## 2017-02-10 DIAGNOSIS — C169 Malignant neoplasm of stomach, unspecified: Secondary | ICD-10-CM | POA: Diagnosis not present

## 2017-02-11 DIAGNOSIS — Z7984 Long term (current) use of oral hypoglycemic drugs: Secondary | ICD-10-CM | POA: Diagnosis not present

## 2017-02-11 DIAGNOSIS — I1310 Hypertensive heart and chronic kidney disease without heart failure, with stage 1 through stage 4 chronic kidney disease, or unspecified chronic kidney disease: Secondary | ICD-10-CM | POA: Diagnosis not present

## 2017-02-11 DIAGNOSIS — I2510 Atherosclerotic heart disease of native coronary artery without angina pectoris: Secondary | ICD-10-CM | POA: Diagnosis not present

## 2017-02-11 DIAGNOSIS — E119 Type 2 diabetes mellitus without complications: Secondary | ICD-10-CM | POA: Diagnosis not present

## 2017-02-11 DIAGNOSIS — Z951 Presence of aortocoronary bypass graft: Secondary | ICD-10-CM | POA: Diagnosis not present

## 2017-02-11 DIAGNOSIS — Z483 Aftercare following surgery for neoplasm: Secondary | ICD-10-CM | POA: Diagnosis not present

## 2017-02-11 DIAGNOSIS — N183 Chronic kidney disease, stage 3 (moderate): Secondary | ICD-10-CM | POA: Diagnosis not present

## 2017-02-11 DIAGNOSIS — M109 Gout, unspecified: Secondary | ICD-10-CM | POA: Diagnosis not present

## 2017-02-11 DIAGNOSIS — D630 Anemia in neoplastic disease: Secondary | ICD-10-CM | POA: Diagnosis not present

## 2017-02-11 DIAGNOSIS — I214 Non-ST elevation (NSTEMI) myocardial infarction: Secondary | ICD-10-CM | POA: Diagnosis not present

## 2017-02-11 DIAGNOSIS — C166 Malignant neoplasm of greater curvature of stomach, unspecified: Secondary | ICD-10-CM | POA: Diagnosis not present

## 2017-02-11 DIAGNOSIS — C169 Malignant neoplasm of stomach, unspecified: Secondary | ICD-10-CM | POA: Diagnosis not present

## 2017-02-11 DIAGNOSIS — Z431 Encounter for attention to gastrostomy: Secondary | ICD-10-CM | POA: Diagnosis not present

## 2017-02-12 DIAGNOSIS — Z431 Encounter for attention to gastrostomy: Secondary | ICD-10-CM | POA: Diagnosis not present

## 2017-02-12 DIAGNOSIS — E119 Type 2 diabetes mellitus without complications: Secondary | ICD-10-CM | POA: Diagnosis not present

## 2017-02-12 DIAGNOSIS — Z09 Encounter for follow-up examination after completed treatment for conditions other than malignant neoplasm: Secondary | ICD-10-CM | POA: Diagnosis not present

## 2017-02-12 DIAGNOSIS — D630 Anemia in neoplastic disease: Secondary | ICD-10-CM | POA: Diagnosis not present

## 2017-02-12 DIAGNOSIS — I2510 Atherosclerotic heart disease of native coronary artery without angina pectoris: Secondary | ICD-10-CM | POA: Diagnosis not present

## 2017-02-12 DIAGNOSIS — I214 Non-ST elevation (NSTEMI) myocardial infarction: Secondary | ICD-10-CM | POA: Diagnosis not present

## 2017-02-12 DIAGNOSIS — Z483 Aftercare following surgery for neoplasm: Secondary | ICD-10-CM | POA: Diagnosis not present

## 2017-02-12 DIAGNOSIS — N183 Chronic kidney disease, stage 3 (moderate): Secondary | ICD-10-CM | POA: Diagnosis not present

## 2017-02-12 DIAGNOSIS — M109 Gout, unspecified: Secondary | ICD-10-CM | POA: Diagnosis not present

## 2017-02-12 DIAGNOSIS — C169 Malignant neoplasm of stomach, unspecified: Secondary | ICD-10-CM | POA: Diagnosis not present

## 2017-02-12 DIAGNOSIS — I1310 Hypertensive heart and chronic kidney disease without heart failure, with stage 1 through stage 4 chronic kidney disease, or unspecified chronic kidney disease: Secondary | ICD-10-CM | POA: Diagnosis not present

## 2017-02-12 DIAGNOSIS — Z951 Presence of aortocoronary bypass graft: Secondary | ICD-10-CM | POA: Diagnosis not present

## 2017-02-12 DIAGNOSIS — Z7984 Long term (current) use of oral hypoglycemic drugs: Secondary | ICD-10-CM | POA: Diagnosis not present

## 2017-02-14 DIAGNOSIS — I1310 Hypertensive heart and chronic kidney disease without heart failure, with stage 1 through stage 4 chronic kidney disease, or unspecified chronic kidney disease: Secondary | ICD-10-CM | POA: Diagnosis not present

## 2017-02-14 DIAGNOSIS — Z7984 Long term (current) use of oral hypoglycemic drugs: Secondary | ICD-10-CM | POA: Diagnosis not present

## 2017-02-14 DIAGNOSIS — C166 Malignant neoplasm of greater curvature of stomach, unspecified: Secondary | ICD-10-CM | POA: Diagnosis not present

## 2017-02-14 DIAGNOSIS — Z951 Presence of aortocoronary bypass graft: Secondary | ICD-10-CM | POA: Diagnosis not present

## 2017-02-14 DIAGNOSIS — Z431 Encounter for attention to gastrostomy: Secondary | ICD-10-CM | POA: Diagnosis not present

## 2017-02-14 DIAGNOSIS — E119 Type 2 diabetes mellitus without complications: Secondary | ICD-10-CM | POA: Diagnosis not present

## 2017-02-14 DIAGNOSIS — Z79899 Other long term (current) drug therapy: Secondary | ICD-10-CM | POA: Diagnosis not present

## 2017-02-14 DIAGNOSIS — N183 Chronic kidney disease, stage 3 (moderate): Secondary | ICD-10-CM | POA: Diagnosis not present

## 2017-02-14 DIAGNOSIS — Z483 Aftercare following surgery for neoplasm: Secondary | ICD-10-CM | POA: Diagnosis not present

## 2017-02-14 DIAGNOSIS — C169 Malignant neoplasm of stomach, unspecified: Secondary | ICD-10-CM | POA: Diagnosis not present

## 2017-02-14 DIAGNOSIS — D630 Anemia in neoplastic disease: Secondary | ICD-10-CM | POA: Diagnosis not present

## 2017-02-14 DIAGNOSIS — M109 Gout, unspecified: Secondary | ICD-10-CM | POA: Diagnosis not present

## 2017-02-14 DIAGNOSIS — I2510 Atherosclerotic heart disease of native coronary artery without angina pectoris: Secondary | ICD-10-CM | POA: Diagnosis not present

## 2017-02-14 DIAGNOSIS — I214 Non-ST elevation (NSTEMI) myocardial infarction: Secondary | ICD-10-CM | POA: Diagnosis not present

## 2017-02-17 DIAGNOSIS — E119 Type 2 diabetes mellitus without complications: Secondary | ICD-10-CM | POA: Diagnosis not present

## 2017-02-17 DIAGNOSIS — I214 Non-ST elevation (NSTEMI) myocardial infarction: Secondary | ICD-10-CM | POA: Diagnosis not present

## 2017-02-17 DIAGNOSIS — C169 Malignant neoplasm of stomach, unspecified: Secondary | ICD-10-CM | POA: Diagnosis not present

## 2017-02-17 DIAGNOSIS — Z951 Presence of aortocoronary bypass graft: Secondary | ICD-10-CM | POA: Diagnosis not present

## 2017-02-17 DIAGNOSIS — Z431 Encounter for attention to gastrostomy: Secondary | ICD-10-CM | POA: Diagnosis not present

## 2017-02-17 DIAGNOSIS — M109 Gout, unspecified: Secondary | ICD-10-CM | POA: Diagnosis not present

## 2017-02-17 DIAGNOSIS — D630 Anemia in neoplastic disease: Secondary | ICD-10-CM | POA: Diagnosis not present

## 2017-02-17 DIAGNOSIS — Z483 Aftercare following surgery for neoplasm: Secondary | ICD-10-CM | POA: Diagnosis not present

## 2017-02-17 DIAGNOSIS — I2510 Atherosclerotic heart disease of native coronary artery without angina pectoris: Secondary | ICD-10-CM | POA: Diagnosis not present

## 2017-02-17 DIAGNOSIS — Z7984 Long term (current) use of oral hypoglycemic drugs: Secondary | ICD-10-CM | POA: Diagnosis not present

## 2017-02-17 DIAGNOSIS — I1310 Hypertensive heart and chronic kidney disease without heart failure, with stage 1 through stage 4 chronic kidney disease, or unspecified chronic kidney disease: Secondary | ICD-10-CM | POA: Diagnosis not present

## 2017-02-17 DIAGNOSIS — N183 Chronic kidney disease, stage 3 (moderate): Secondary | ICD-10-CM | POA: Diagnosis not present

## 2017-02-18 DIAGNOSIS — C169 Malignant neoplasm of stomach, unspecified: Secondary | ICD-10-CM | POA: Diagnosis not present

## 2017-02-18 DIAGNOSIS — C166 Malignant neoplasm of greater curvature of stomach, unspecified: Secondary | ICD-10-CM | POA: Diagnosis not present

## 2017-02-18 DIAGNOSIS — E860 Dehydration: Secondary | ICD-10-CM | POA: Diagnosis not present

## 2017-02-18 DIAGNOSIS — R197 Diarrhea, unspecified: Secondary | ICD-10-CM | POA: Diagnosis not present

## 2017-02-18 DIAGNOSIS — R945 Abnormal results of liver function studies: Secondary | ICD-10-CM | POA: Diagnosis not present

## 2017-02-19 DIAGNOSIS — R933 Abnormal findings on diagnostic imaging of other parts of digestive tract: Secondary | ICD-10-CM | POA: Diagnosis not present

## 2017-02-19 DIAGNOSIS — Z9884 Bariatric surgery status: Secondary | ICD-10-CM | POA: Diagnosis not present

## 2017-02-19 DIAGNOSIS — I110 Hypertensive heart disease with heart failure: Secondary | ICD-10-CM | POA: Diagnosis not present

## 2017-02-19 DIAGNOSIS — Z903 Acquired absence of stomach [part of]: Secondary | ICD-10-CM | POA: Diagnosis not present

## 2017-02-19 DIAGNOSIS — C169 Malignant neoplasm of stomach, unspecified: Secondary | ICD-10-CM | POA: Diagnosis not present

## 2017-02-19 DIAGNOSIS — R935 Abnormal findings on diagnostic imaging of other abdominal regions, including retroperitoneum: Secondary | ICD-10-CM | POA: Diagnosis not present

## 2017-02-19 DIAGNOSIS — N200 Calculus of kidney: Secondary | ICD-10-CM | POA: Diagnosis not present

## 2017-02-20 DIAGNOSIS — E785 Hyperlipidemia, unspecified: Secondary | ICD-10-CM | POA: Diagnosis not present

## 2017-02-20 DIAGNOSIS — I214 Non-ST elevation (NSTEMI) myocardial infarction: Secondary | ICD-10-CM | POA: Diagnosis not present

## 2017-02-20 DIAGNOSIS — I110 Hypertensive heart disease with heart failure: Secondary | ICD-10-CM | POA: Diagnosis not present

## 2017-02-20 DIAGNOSIS — I5032 Chronic diastolic (congestive) heart failure: Secondary | ICD-10-CM | POA: Diagnosis not present

## 2017-02-20 DIAGNOSIS — I25119 Atherosclerotic heart disease of native coronary artery with unspecified angina pectoris: Secondary | ICD-10-CM | POA: Diagnosis not present

## 2017-02-21 DIAGNOSIS — C166 Malignant neoplasm of greater curvature of stomach, unspecified: Secondary | ICD-10-CM | POA: Diagnosis not present

## 2017-02-25 DIAGNOSIS — D72829 Elevated white blood cell count, unspecified: Secondary | ICD-10-CM | POA: Diagnosis not present

## 2017-02-25 DIAGNOSIS — C166 Malignant neoplasm of greater curvature of stomach, unspecified: Secondary | ICD-10-CM | POA: Diagnosis not present

## 2017-02-25 DIAGNOSIS — Z85020 Personal history of malignant carcinoid tumor of stomach: Secondary | ICD-10-CM

## 2017-02-25 DIAGNOSIS — D500 Iron deficiency anemia secondary to blood loss (chronic): Secondary | ICD-10-CM | POA: Diagnosis not present

## 2017-02-25 DIAGNOSIS — Z8507 Personal history of malignant neoplasm of pancreas: Secondary | ICD-10-CM

## 2017-02-25 DIAGNOSIS — C169 Malignant neoplasm of stomach, unspecified: Secondary | ICD-10-CM | POA: Diagnosis not present

## 2017-02-25 DIAGNOSIS — E860 Dehydration: Secondary | ICD-10-CM | POA: Diagnosis not present

## 2017-02-25 DIAGNOSIS — R197 Diarrhea, unspecified: Secondary | ICD-10-CM

## 2017-02-25 DIAGNOSIS — D472 Monoclonal gammopathy: Secondary | ICD-10-CM | POA: Diagnosis not present

## 2017-02-25 DIAGNOSIS — I2510 Atherosclerotic heart disease of native coronary artery without angina pectoris: Secondary | ICD-10-CM | POA: Diagnosis not present

## 2017-02-28 DIAGNOSIS — I214 Non-ST elevation (NSTEMI) myocardial infarction: Secondary | ICD-10-CM | POA: Diagnosis not present

## 2017-02-28 DIAGNOSIS — C169 Malignant neoplasm of stomach, unspecified: Secondary | ICD-10-CM | POA: Diagnosis not present

## 2017-02-28 DIAGNOSIS — E119 Type 2 diabetes mellitus without complications: Secondary | ICD-10-CM | POA: Diagnosis not present

## 2017-02-28 DIAGNOSIS — I110 Hypertensive heart disease with heart failure: Secondary | ICD-10-CM | POA: Diagnosis not present

## 2017-02-28 DIAGNOSIS — Z951 Presence of aortocoronary bypass graft: Secondary | ICD-10-CM | POA: Diagnosis not present

## 2017-02-28 DIAGNOSIS — M109 Gout, unspecified: Secondary | ICD-10-CM | POA: Diagnosis not present

## 2017-02-28 DIAGNOSIS — Z431 Encounter for attention to gastrostomy: Secondary | ICD-10-CM | POA: Diagnosis not present

## 2017-02-28 DIAGNOSIS — Z7984 Long term (current) use of oral hypoglycemic drugs: Secondary | ICD-10-CM | POA: Diagnosis not present

## 2017-02-28 DIAGNOSIS — D630 Anemia in neoplastic disease: Secondary | ICD-10-CM | POA: Diagnosis not present

## 2017-02-28 DIAGNOSIS — C166 Malignant neoplasm of greater curvature of stomach, unspecified: Secondary | ICD-10-CM | POA: Diagnosis not present

## 2017-02-28 DIAGNOSIS — N183 Chronic kidney disease, stage 3 (moderate): Secondary | ICD-10-CM | POA: Diagnosis not present

## 2017-02-28 DIAGNOSIS — Z483 Aftercare following surgery for neoplasm: Secondary | ICD-10-CM | POA: Diagnosis not present

## 2017-02-28 DIAGNOSIS — I2510 Atherosclerotic heart disease of native coronary artery without angina pectoris: Secondary | ICD-10-CM | POA: Diagnosis not present

## 2017-02-28 DIAGNOSIS — I1310 Hypertensive heart and chronic kidney disease without heart failure, with stage 1 through stage 4 chronic kidney disease, or unspecified chronic kidney disease: Secondary | ICD-10-CM | POA: Diagnosis not present

## 2017-03-04 DIAGNOSIS — E861 Hypovolemia: Secondary | ICD-10-CM | POA: Diagnosis not present

## 2017-03-04 DIAGNOSIS — Z8507 Personal history of malignant neoplasm of pancreas: Secondary | ICD-10-CM | POA: Diagnosis not present

## 2017-03-04 DIAGNOSIS — C166 Malignant neoplasm of greater curvature of stomach, unspecified: Secondary | ICD-10-CM | POA: Diagnosis not present

## 2017-03-06 DIAGNOSIS — C166 Malignant neoplasm of greater curvature of stomach, unspecified: Secondary | ICD-10-CM | POA: Diagnosis not present

## 2017-03-06 DIAGNOSIS — I214 Non-ST elevation (NSTEMI) myocardial infarction: Secondary | ICD-10-CM | POA: Diagnosis not present

## 2017-03-06 DIAGNOSIS — I110 Hypertensive heart disease with heart failure: Secondary | ICD-10-CM | POA: Diagnosis not present

## 2017-03-11 DIAGNOSIS — E860 Dehydration: Secondary | ICD-10-CM | POA: Diagnosis not present

## 2017-03-11 DIAGNOSIS — C166 Malignant neoplasm of greater curvature of stomach, unspecified: Secondary | ICD-10-CM | POA: Diagnosis not present

## 2017-03-11 DIAGNOSIS — Z8507 Personal history of malignant neoplasm of pancreas: Secondary | ICD-10-CM | POA: Diagnosis not present

## 2017-03-11 DIAGNOSIS — D649 Anemia, unspecified: Secondary | ICD-10-CM | POA: Diagnosis not present

## 2017-03-11 DIAGNOSIS — R197 Diarrhea, unspecified: Secondary | ICD-10-CM | POA: Diagnosis not present

## 2017-03-11 DIAGNOSIS — R740 Nonspecific elevation of levels of transaminase and lactic acid dehydrogenase [LDH]: Secondary | ICD-10-CM | POA: Diagnosis not present

## 2017-03-14 DIAGNOSIS — C166 Malignant neoplasm of greater curvature of stomach, unspecified: Secondary | ICD-10-CM | POA: Diagnosis not present

## 2017-03-18 DIAGNOSIS — C166 Malignant neoplasm of greater curvature of stomach, unspecified: Secondary | ICD-10-CM | POA: Diagnosis not present

## 2017-03-18 DIAGNOSIS — D649 Anemia, unspecified: Secondary | ICD-10-CM | POA: Diagnosis not present

## 2017-03-18 DIAGNOSIS — D472 Monoclonal gammopathy: Secondary | ICD-10-CM | POA: Diagnosis not present

## 2017-03-18 DIAGNOSIS — Z8507 Personal history of malignant neoplasm of pancreas: Secondary | ICD-10-CM | POA: Diagnosis not present

## 2017-03-19 DIAGNOSIS — Z09 Encounter for follow-up examination after completed treatment for conditions other than malignant neoplasm: Secondary | ICD-10-CM | POA: Diagnosis not present

## 2017-03-19 DIAGNOSIS — C161 Malignant neoplasm of fundus of stomach: Secondary | ICD-10-CM | POA: Diagnosis not present

## 2017-03-21 DIAGNOSIS — I110 Hypertensive heart disease with heart failure: Secondary | ICD-10-CM | POA: Diagnosis not present

## 2017-03-21 DIAGNOSIS — C166 Malignant neoplasm of greater curvature of stomach, unspecified: Secondary | ICD-10-CM | POA: Diagnosis not present

## 2017-03-21 DIAGNOSIS — D472 Monoclonal gammopathy: Secondary | ICD-10-CM | POA: Diagnosis not present

## 2017-03-21 DIAGNOSIS — C169 Malignant neoplasm of stomach, unspecified: Secondary | ICD-10-CM | POA: Diagnosis not present

## 2017-03-25 DIAGNOSIS — D472 Monoclonal gammopathy: Secondary | ICD-10-CM | POA: Diagnosis not present

## 2017-03-25 DIAGNOSIS — C166 Malignant neoplasm of greater curvature of stomach, unspecified: Secondary | ICD-10-CM | POA: Diagnosis not present

## 2017-03-25 DIAGNOSIS — Z8507 Personal history of malignant neoplasm of pancreas: Secondary | ICD-10-CM | POA: Diagnosis not present

## 2017-03-27 DIAGNOSIS — R233 Spontaneous ecchymoses: Secondary | ICD-10-CM | POA: Diagnosis not present

## 2017-03-27 DIAGNOSIS — L570 Actinic keratosis: Secondary | ICD-10-CM | POA: Diagnosis not present

## 2017-03-29 DIAGNOSIS — M545 Low back pain: Secondary | ICD-10-CM | POA: Diagnosis not present

## 2017-03-31 DIAGNOSIS — I110 Hypertensive heart disease with heart failure: Secondary | ICD-10-CM | POA: Diagnosis not present

## 2017-03-31 DIAGNOSIS — C169 Malignant neoplasm of stomach, unspecified: Secondary | ICD-10-CM | POA: Diagnosis not present

## 2017-04-01 DIAGNOSIS — C166 Malignant neoplasm of greater curvature of stomach, unspecified: Secondary | ICD-10-CM | POA: Diagnosis not present

## 2017-04-01 DIAGNOSIS — E860 Dehydration: Secondary | ICD-10-CM | POA: Diagnosis not present

## 2017-04-01 DIAGNOSIS — R197 Diarrhea, unspecified: Secondary | ICD-10-CM | POA: Diagnosis not present

## 2017-04-01 DIAGNOSIS — D72829 Elevated white blood cell count, unspecified: Secondary | ICD-10-CM | POA: Diagnosis not present

## 2017-04-04 DIAGNOSIS — C166 Malignant neoplasm of greater curvature of stomach, unspecified: Secondary | ICD-10-CM | POA: Diagnosis not present

## 2017-04-04 DIAGNOSIS — D472 Monoclonal gammopathy: Secondary | ICD-10-CM | POA: Diagnosis not present

## 2017-04-08 DIAGNOSIS — R197 Diarrhea, unspecified: Secondary | ICD-10-CM | POA: Diagnosis not present

## 2017-04-08 DIAGNOSIS — D649 Anemia, unspecified: Secondary | ICD-10-CM | POA: Diagnosis not present

## 2017-04-08 DIAGNOSIS — I2510 Atherosclerotic heart disease of native coronary artery without angina pectoris: Secondary | ICD-10-CM | POA: Diagnosis not present

## 2017-04-08 DIAGNOSIS — R32 Unspecified urinary incontinence: Secondary | ICD-10-CM

## 2017-04-08 DIAGNOSIS — R300 Dysuria: Secondary | ICD-10-CM | POA: Diagnosis not present

## 2017-04-08 DIAGNOSIS — C166 Malignant neoplasm of greater curvature of stomach, unspecified: Secondary | ICD-10-CM | POA: Diagnosis not present

## 2017-04-08 DIAGNOSIS — E860 Dehydration: Secondary | ICD-10-CM | POA: Diagnosis not present

## 2017-04-08 DIAGNOSIS — C169 Malignant neoplasm of stomach, unspecified: Secondary | ICD-10-CM | POA: Diagnosis not present

## 2017-04-08 DIAGNOSIS — B370 Candidal stomatitis: Secondary | ICD-10-CM | POA: Diagnosis not present

## 2017-04-08 DIAGNOSIS — Z85020 Personal history of malignant carcinoid tumor of stomach: Secondary | ICD-10-CM

## 2017-04-08 DIAGNOSIS — Z8507 Personal history of malignant neoplasm of pancreas: Secondary | ICD-10-CM | POA: Diagnosis not present

## 2017-04-08 DIAGNOSIS — D72829 Elevated white blood cell count, unspecified: Secondary | ICD-10-CM | POA: Diagnosis not present

## 2017-04-11 DIAGNOSIS — C252 Malignant neoplasm of tail of pancreas: Secondary | ICD-10-CM | POA: Diagnosis not present

## 2017-04-11 DIAGNOSIS — M5126 Other intervertebral disc displacement, lumbar region: Secondary | ICD-10-CM | POA: Diagnosis not present

## 2017-04-11 DIAGNOSIS — M48061 Spinal stenosis, lumbar region without neurogenic claudication: Secondary | ICD-10-CM | POA: Diagnosis not present

## 2017-04-11 DIAGNOSIS — M47816 Spondylosis without myelopathy or radiculopathy, lumbar region: Secondary | ICD-10-CM | POA: Diagnosis not present

## 2017-04-11 DIAGNOSIS — M47812 Spondylosis without myelopathy or radiculopathy, cervical region: Secondary | ICD-10-CM | POA: Diagnosis not present

## 2017-04-11 DIAGNOSIS — M47814 Spondylosis without myelopathy or radiculopathy, thoracic region: Secondary | ICD-10-CM | POA: Diagnosis not present

## 2017-04-15 DIAGNOSIS — S32029A Unspecified fracture of second lumbar vertebra, initial encounter for closed fracture: Secondary | ICD-10-CM | POA: Diagnosis not present

## 2017-04-15 DIAGNOSIS — S32049A Unspecified fracture of fourth lumbar vertebra, initial encounter for closed fracture: Secondary | ICD-10-CM | POA: Diagnosis not present

## 2017-04-15 DIAGNOSIS — D72829 Elevated white blood cell count, unspecified: Secondary | ICD-10-CM | POA: Diagnosis not present

## 2017-04-15 DIAGNOSIS — E871 Hypo-osmolality and hyponatremia: Secondary | ICD-10-CM | POA: Diagnosis not present

## 2017-04-15 DIAGNOSIS — C169 Malignant neoplasm of stomach, unspecified: Secondary | ICD-10-CM | POA: Diagnosis not present

## 2017-04-15 DIAGNOSIS — I2510 Atherosclerotic heart disease of native coronary artery without angina pectoris: Secondary | ICD-10-CM | POA: Diagnosis not present

## 2017-04-15 DIAGNOSIS — D649 Anemia, unspecified: Secondary | ICD-10-CM | POA: Diagnosis not present

## 2017-04-15 DIAGNOSIS — Z85020 Personal history of malignant carcinoid tumor of stomach: Secondary | ICD-10-CM

## 2017-04-15 DIAGNOSIS — R197 Diarrhea, unspecified: Secondary | ICD-10-CM | POA: Diagnosis not present

## 2017-04-15 DIAGNOSIS — E860 Dehydration: Secondary | ICD-10-CM | POA: Diagnosis not present

## 2017-04-15 DIAGNOSIS — Z8507 Personal history of malignant neoplasm of pancreas: Secondary | ICD-10-CM | POA: Diagnosis not present

## 2017-04-15 DIAGNOSIS — C166 Malignant neoplasm of greater curvature of stomach, unspecified: Secondary | ICD-10-CM | POA: Diagnosis not present

## 2017-04-15 DIAGNOSIS — N189 Chronic kidney disease, unspecified: Secondary | ICD-10-CM | POA: Diagnosis not present

## 2017-04-18 DIAGNOSIS — D649 Anemia, unspecified: Secondary | ICD-10-CM | POA: Diagnosis not present

## 2017-04-18 DIAGNOSIS — N189 Chronic kidney disease, unspecified: Secondary | ICD-10-CM | POA: Diagnosis not present

## 2017-04-18 DIAGNOSIS — E860 Dehydration: Secondary | ICD-10-CM | POA: Diagnosis not present

## 2017-04-18 DIAGNOSIS — E871 Hypo-osmolality and hyponatremia: Secondary | ICD-10-CM | POA: Diagnosis not present

## 2017-04-18 DIAGNOSIS — I2510 Atherosclerotic heart disease of native coronary artery without angina pectoris: Secondary | ICD-10-CM | POA: Diagnosis not present

## 2017-04-18 DIAGNOSIS — R197 Diarrhea, unspecified: Secondary | ICD-10-CM | POA: Diagnosis not present

## 2017-04-18 DIAGNOSIS — D72829 Elevated white blood cell count, unspecified: Secondary | ICD-10-CM | POA: Diagnosis not present

## 2017-04-18 DIAGNOSIS — C166 Malignant neoplasm of greater curvature of stomach, unspecified: Secondary | ICD-10-CM | POA: Diagnosis not present

## 2017-04-18 DIAGNOSIS — Z8507 Personal history of malignant neoplasm of pancreas: Secondary | ICD-10-CM | POA: Diagnosis not present

## 2017-04-22 DIAGNOSIS — D72829 Elevated white blood cell count, unspecified: Secondary | ICD-10-CM | POA: Diagnosis not present

## 2017-04-22 DIAGNOSIS — E860 Dehydration: Secondary | ICD-10-CM | POA: Diagnosis not present

## 2017-04-22 DIAGNOSIS — M4856XA Collapsed vertebra, not elsewhere classified, lumbar region, initial encounter for fracture: Secondary | ICD-10-CM | POA: Diagnosis not present

## 2017-04-22 DIAGNOSIS — R197 Diarrhea, unspecified: Secondary | ICD-10-CM | POA: Diagnosis not present

## 2017-04-22 DIAGNOSIS — I2510 Atherosclerotic heart disease of native coronary artery without angina pectoris: Secondary | ICD-10-CM | POA: Diagnosis not present

## 2017-04-22 DIAGNOSIS — Z8507 Personal history of malignant neoplasm of pancreas: Secondary | ICD-10-CM | POA: Diagnosis not present

## 2017-04-22 DIAGNOSIS — R5383 Other fatigue: Secondary | ICD-10-CM | POA: Diagnosis not present

## 2017-04-22 DIAGNOSIS — D649 Anemia, unspecified: Secondary | ICD-10-CM | POA: Diagnosis not present

## 2017-04-22 DIAGNOSIS — C166 Malignant neoplasm of greater curvature of stomach, unspecified: Secondary | ICD-10-CM | POA: Diagnosis not present

## 2017-04-25 DIAGNOSIS — R197 Diarrhea, unspecified: Secondary | ICD-10-CM | POA: Diagnosis not present

## 2017-04-25 DIAGNOSIS — Z8507 Personal history of malignant neoplasm of pancreas: Secondary | ICD-10-CM | POA: Diagnosis not present

## 2017-04-25 DIAGNOSIS — D72829 Elevated white blood cell count, unspecified: Secondary | ICD-10-CM | POA: Diagnosis not present

## 2017-04-25 DIAGNOSIS — D649 Anemia, unspecified: Secondary | ICD-10-CM | POA: Diagnosis not present

## 2017-04-25 DIAGNOSIS — E860 Dehydration: Secondary | ICD-10-CM | POA: Diagnosis not present

## 2017-04-25 DIAGNOSIS — C166 Malignant neoplasm of greater curvature of stomach, unspecified: Secondary | ICD-10-CM | POA: Diagnosis not present

## 2017-04-25 DIAGNOSIS — R5383 Other fatigue: Secondary | ICD-10-CM | POA: Diagnosis not present

## 2017-04-28 IMAGING — CT CT ABD-PELV W/ CM
2 of 5 series · 16 of 46 positions shown, 18 images · IV contrast (Omni 300)
Comparison: None.

CLINICAL DATA: Sudden onset Mid to epigastric pain. Patient had a
syncopal episode this morning and vomited blood.

EXAM:
CT ABDOMEN AND PELVIS WITH CONTRAST
TECHNIQUE: Multidetector CT imaging of the abdomen and pelvis was performed
using the standard protocol following bolus administration of
intravenous contrast.
CONTRAST:  75mL FPIY9D-388 IOPAMIDOL (FPIY9D-388) INJECTION 61%

[Series 2: a/p w/ 5mm · axial · 0.81mm/px · z∈[+779,+1189]mm · 13 of 94 slices shown, 15 images]
[im 6/94  soft-tissue]
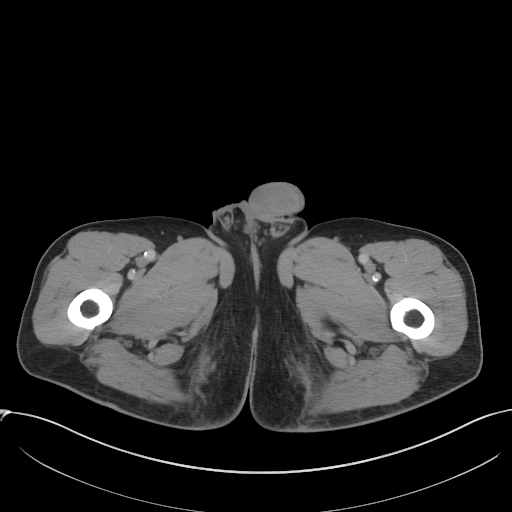
[im 6/94  bone]
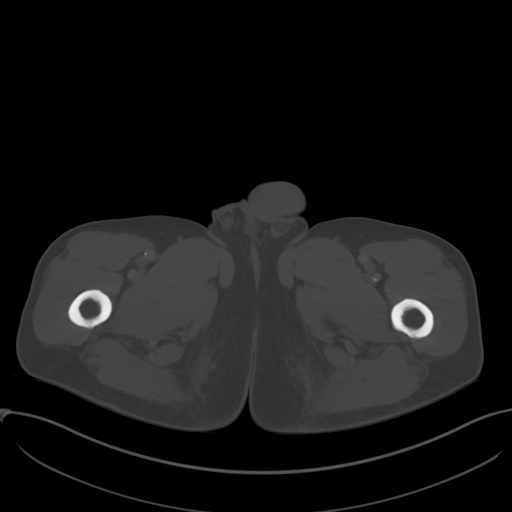
[im 11/94  soft-tissue]
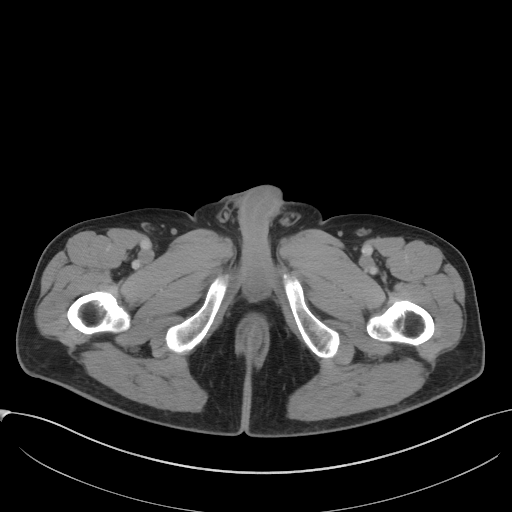
[im 21/94  soft-tissue]
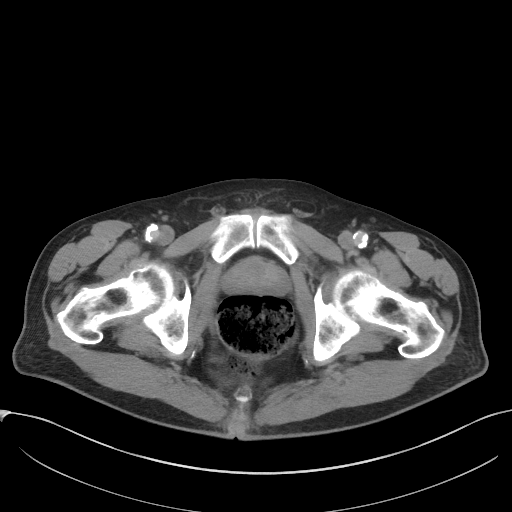
[im 26/94  soft-tissue]
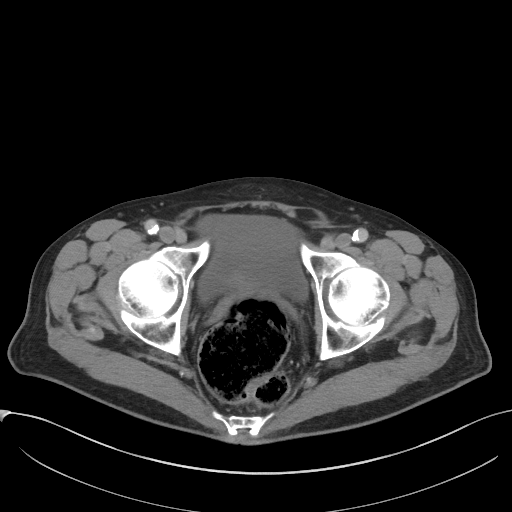
[im 32/94  soft-tissue]
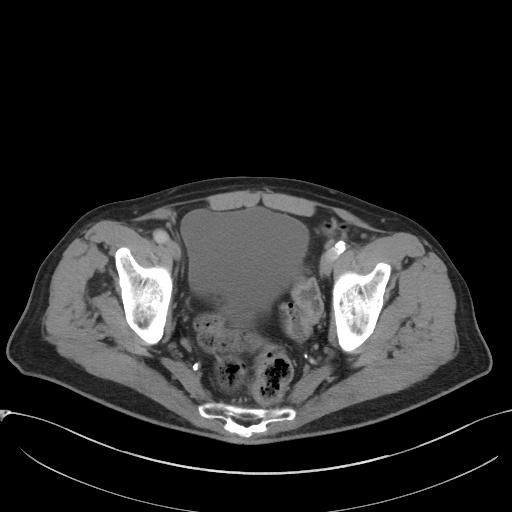
[im 42/94  soft-tissue]
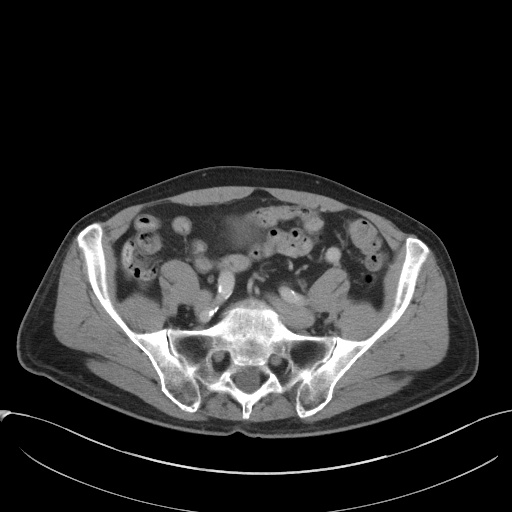
[im 47/94  soft-tissue]
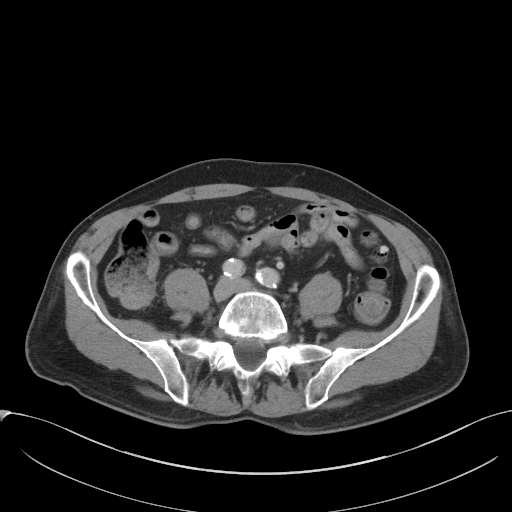
[im 52/94  soft-tissue]
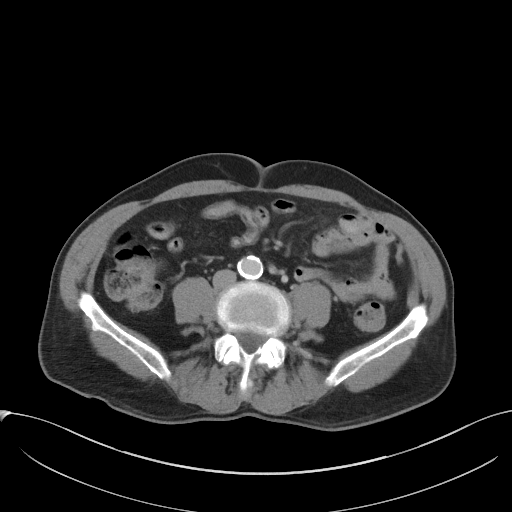
[im 63/94  soft-tissue]
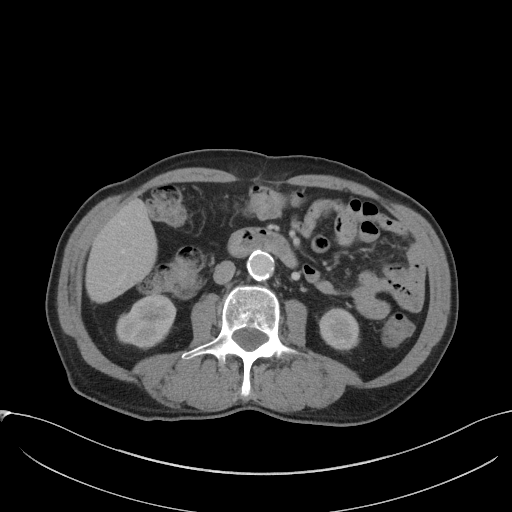
[im 63/94  bone]
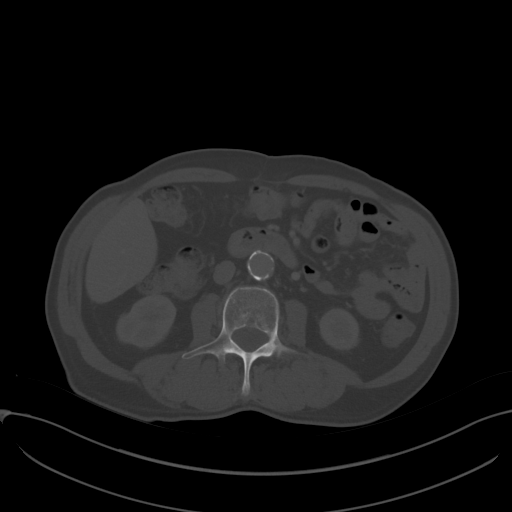
[im 68/94  soft-tissue]
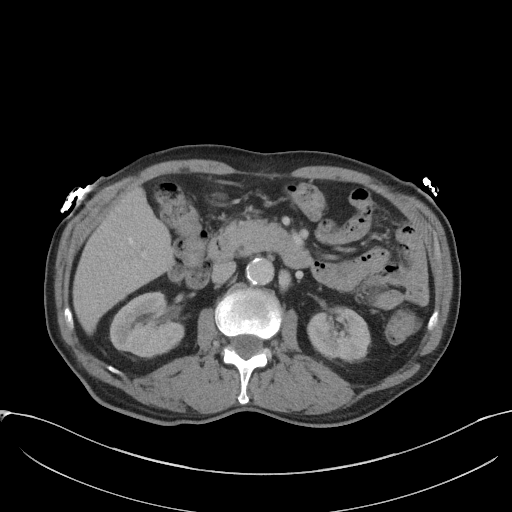
[im 73/94  soft-tissue]
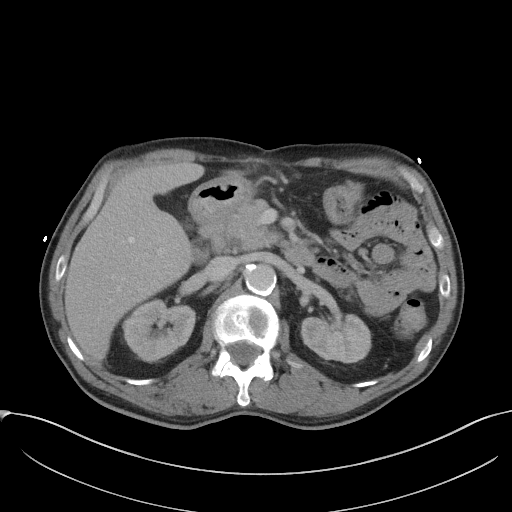
[im 83/94  soft-tissue]
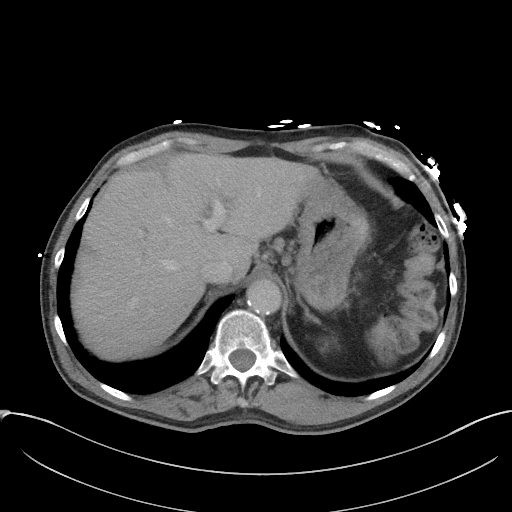
[im 88/94  soft-tissue]
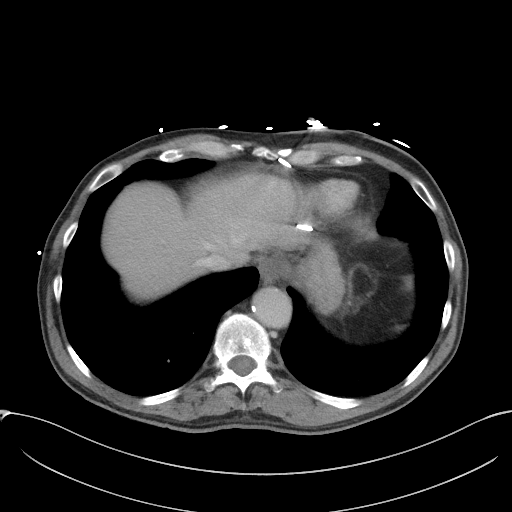

[Series 5: a/p w/ cor · coronal · 0.91mm/px · 3 of 127 slices shown]
[im 43/127  soft-tissue]
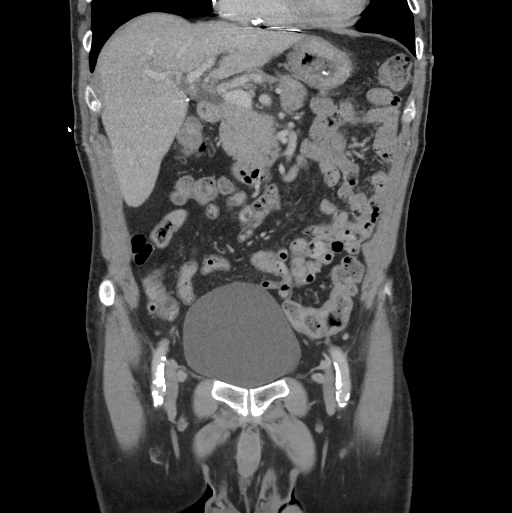
[im 57/127  soft-tissue]
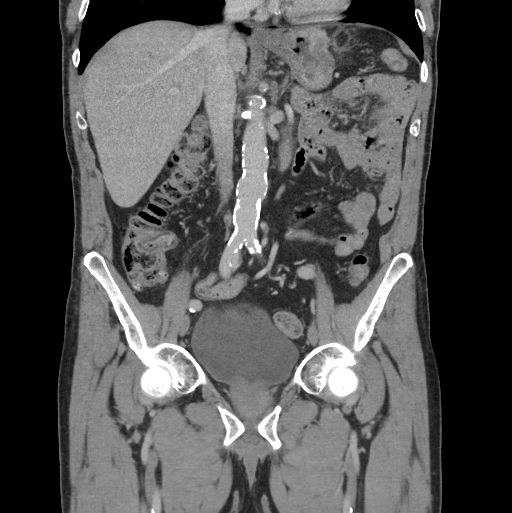
[im 71/127  soft-tissue]
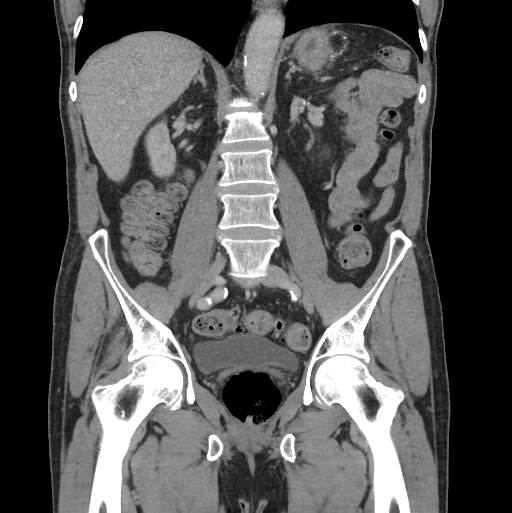

[16 of 46 positions shown; findings below may reference images not displayed]

FINDINGS: Lower chest: Clear lung bases. Heart normal size. Dense coronary
artery calcifications.

Hepatobiliary: No liver mass or focal lesion. Gallbladder surgically
absent. There is mild chronic intra and extrahepatic bile duct
prominence with normal common bile duct distal tapering.

Pancreas: Absent pancreatic tail consistent with previous resection.
Otherwise unremarkable.

Spleen: Status post spleen ectomy.

Adrenals/Urinary Tract: Mild renal cortical thinning. Small
bilateral nonobstructing intrarenal stones. No masses. No
hydronephrosis. Normal ureters. Bladder is unremarkable.

Stomach/Bowel: Stomach is not well distended. No convincing mass,
ulceration or inflammation. Small bowel is unremarkable. There
several left colon diverticula. No diverticulitis. Colon otherwise
unremarkable. Normal appendix visualized.

Vascular/Lymphatic: Abdominal aorta is distended to 2.8 cm in its
infrarenal portion. There is atherosclerotic calcification along the
abdominal aorta its branch vessels. Prominent nodes are noted along
the periceliac and gastrohepatic ligament chains, largest measuring
1 cm short axis. These are most likely reactive.

Reproductive: Prostate mildly bulges against the posterior inferior
bladder base. It is mildly enlarged.

Other: No abdominal wall hernia or abnormality. No abdominopelvic
ascites.

Musculoskeletal: Mild depression of the upper endplate of L2 with
associated prominent Schmorl's node. This appears chronic. No other
fractures. Mild disc degenerative change at L5-S1. No osteoblastic
or osteolytic lesions.
IMPRESSION: 1. No acute findings within the abdomen or pelvis.
2. Normal appendix visualized.
3. Status post cholecystectomy.
4. Small nonobstructing intrarenal stones.
5. Ectatic abdominal aorta at risk for aneurysm development.
Recommend followup by ultrasound in 5 years. This recommendation
follows ACR consensus guidelines: White Paper of the ACR Incidental
Findings Committee II on Vascular Findings. [HOSPITAL] 3109;
[DATE].
6. Aortic atherosclerosis.
7. Prominent upper abdominal lymph nodes as described most likely
reactive.

## 2017-04-29 DIAGNOSIS — R5383 Other fatigue: Secondary | ICD-10-CM | POA: Diagnosis not present

## 2017-04-29 DIAGNOSIS — E860 Dehydration: Secondary | ICD-10-CM | POA: Diagnosis not present

## 2017-04-29 DIAGNOSIS — C166 Malignant neoplasm of greater curvature of stomach, unspecified: Secondary | ICD-10-CM | POA: Diagnosis not present

## 2017-04-29 DIAGNOSIS — Z8507 Personal history of malignant neoplasm of pancreas: Secondary | ICD-10-CM | POA: Diagnosis not present

## 2017-04-29 DIAGNOSIS — R197 Diarrhea, unspecified: Secondary | ICD-10-CM | POA: Diagnosis not present

## 2017-04-29 DIAGNOSIS — D649 Anemia, unspecified: Secondary | ICD-10-CM | POA: Diagnosis not present

## 2017-04-29 DIAGNOSIS — C169 Malignant neoplasm of stomach, unspecified: Secondary | ICD-10-CM | POA: Diagnosis not present

## 2017-04-29 DIAGNOSIS — D72829 Elevated white blood cell count, unspecified: Secondary | ICD-10-CM | POA: Diagnosis not present

## 2017-04-29 DIAGNOSIS — I110 Hypertensive heart disease with heart failure: Secondary | ICD-10-CM | POA: Diagnosis not present

## 2017-04-30 DIAGNOSIS — I110 Hypertensive heart disease with heart failure: Secondary | ICD-10-CM | POA: Diagnosis not present

## 2017-04-30 DIAGNOSIS — C169 Malignant neoplasm of stomach, unspecified: Secondary | ICD-10-CM | POA: Diagnosis not present

## 2017-04-30 DIAGNOSIS — C161 Malignant neoplasm of fundus of stomach: Secondary | ICD-10-CM | POA: Diagnosis not present

## 2017-05-02 DIAGNOSIS — E860 Dehydration: Secondary | ICD-10-CM | POA: Diagnosis not present

## 2017-05-02 DIAGNOSIS — D649 Anemia, unspecified: Secondary | ICD-10-CM | POA: Diagnosis not present

## 2017-05-02 DIAGNOSIS — D72829 Elevated white blood cell count, unspecified: Secondary | ICD-10-CM | POA: Diagnosis not present

## 2017-05-02 DIAGNOSIS — Z8507 Personal history of malignant neoplasm of pancreas: Secondary | ICD-10-CM | POA: Diagnosis not present

## 2017-05-02 DIAGNOSIS — R5383 Other fatigue: Secondary | ICD-10-CM | POA: Diagnosis not present

## 2017-05-02 DIAGNOSIS — R197 Diarrhea, unspecified: Secondary | ICD-10-CM | POA: Diagnosis not present

## 2017-05-02 DIAGNOSIS — C166 Malignant neoplasm of greater curvature of stomach, unspecified: Secondary | ICD-10-CM | POA: Diagnosis not present

## 2017-05-06 DIAGNOSIS — R630 Anorexia: Secondary | ICD-10-CM

## 2017-05-06 DIAGNOSIS — D72829 Elevated white blood cell count, unspecified: Secondary | ICD-10-CM | POA: Diagnosis not present

## 2017-05-06 DIAGNOSIS — R5383 Other fatigue: Secondary | ICD-10-CM | POA: Diagnosis not present

## 2017-05-06 DIAGNOSIS — E860 Dehydration: Secondary | ICD-10-CM | POA: Diagnosis not present

## 2017-05-06 DIAGNOSIS — D649 Anemia, unspecified: Secondary | ICD-10-CM | POA: Diagnosis not present

## 2017-05-06 DIAGNOSIS — R634 Abnormal weight loss: Secondary | ICD-10-CM

## 2017-05-06 DIAGNOSIS — C166 Malignant neoplasm of greater curvature of stomach, unspecified: Secondary | ICD-10-CM | POA: Diagnosis not present

## 2017-05-06 DIAGNOSIS — C772 Secondary and unspecified malignant neoplasm of intra-abdominal lymph nodes: Secondary | ICD-10-CM | POA: Diagnosis not present

## 2017-05-06 DIAGNOSIS — R300 Dysuria: Secondary | ICD-10-CM | POA: Diagnosis not present

## 2017-05-06 DIAGNOSIS — C169 Malignant neoplasm of stomach, unspecified: Secondary | ICD-10-CM

## 2017-05-09 DIAGNOSIS — Z8507 Personal history of malignant neoplasm of pancreas: Secondary | ICD-10-CM | POA: Diagnosis not present

## 2017-05-09 DIAGNOSIS — D500 Iron deficiency anemia secondary to blood loss (chronic): Secondary | ICD-10-CM | POA: Diagnosis not present

## 2017-05-09 DIAGNOSIS — M8588 Other specified disorders of bone density and structure, other site: Secondary | ICD-10-CM | POA: Diagnosis not present

## 2017-05-09 DIAGNOSIS — D472 Monoclonal gammopathy: Secondary | ICD-10-CM | POA: Diagnosis not present

## 2017-05-09 DIAGNOSIS — C166 Malignant neoplasm of greater curvature of stomach, unspecified: Secondary | ICD-10-CM | POA: Diagnosis not present

## 2017-05-09 DIAGNOSIS — M8589 Other specified disorders of bone density and structure, multiple sites: Secondary | ICD-10-CM | POA: Diagnosis not present

## 2017-05-13 DIAGNOSIS — C166 Malignant neoplasm of greater curvature of stomach, unspecified: Secondary | ICD-10-CM | POA: Diagnosis not present

## 2017-05-13 DIAGNOSIS — Z8507 Personal history of malignant neoplasm of pancreas: Secondary | ICD-10-CM | POA: Diagnosis not present

## 2017-05-13 DIAGNOSIS — D472 Monoclonal gammopathy: Secondary | ICD-10-CM | POA: Diagnosis not present

## 2017-05-13 DIAGNOSIS — D500 Iron deficiency anemia secondary to blood loss (chronic): Secondary | ICD-10-CM | POA: Diagnosis not present

## 2017-05-16 DIAGNOSIS — G893 Neoplasm related pain (acute) (chronic): Secondary | ICD-10-CM | POA: Diagnosis not present

## 2017-05-16 DIAGNOSIS — C166 Malignant neoplasm of greater curvature of stomach, unspecified: Secondary | ICD-10-CM | POA: Diagnosis not present

## 2017-05-16 DIAGNOSIS — Z903 Acquired absence of stomach [part of]: Secondary | ICD-10-CM | POA: Diagnosis not present

## 2017-05-16 DIAGNOSIS — C169 Malignant neoplasm of stomach, unspecified: Secondary | ICD-10-CM | POA: Diagnosis not present

## 2017-05-16 DIAGNOSIS — D472 Monoclonal gammopathy: Secondary | ICD-10-CM | POA: Diagnosis not present

## 2017-05-17 DIAGNOSIS — E871 Hypo-osmolality and hyponatremia: Secondary | ICD-10-CM | POA: Diagnosis not present

## 2017-05-17 DIAGNOSIS — D472 Monoclonal gammopathy: Secondary | ICD-10-CM | POA: Diagnosis not present

## 2017-05-17 DIAGNOSIS — Z931 Gastrostomy status: Secondary | ICD-10-CM | POA: Diagnosis not present

## 2017-05-17 DIAGNOSIS — K529 Noninfective gastroenteritis and colitis, unspecified: Secondary | ICD-10-CM | POA: Diagnosis not present

## 2017-05-17 DIAGNOSIS — Z9981 Dependence on supplemental oxygen: Secondary | ICD-10-CM | POA: Diagnosis not present

## 2017-05-17 DIAGNOSIS — I25810 Atherosclerosis of coronary artery bypass graft(s) without angina pectoris: Secondary | ICD-10-CM | POA: Diagnosis not present

## 2017-05-17 DIAGNOSIS — D72829 Elevated white blood cell count, unspecified: Secondary | ICD-10-CM | POA: Diagnosis not present

## 2017-05-17 DIAGNOSIS — C779 Secondary and unspecified malignant neoplasm of lymph node, unspecified: Secondary | ICD-10-CM | POA: Diagnosis not present

## 2017-05-17 DIAGNOSIS — R1312 Dysphagia, oropharyngeal phase: Secondary | ICD-10-CM | POA: Diagnosis not present

## 2017-05-17 DIAGNOSIS — E43 Unspecified severe protein-calorie malnutrition: Secondary | ICD-10-CM | POA: Diagnosis not present

## 2017-05-17 DIAGNOSIS — E860 Dehydration: Secondary | ICD-10-CM | POA: Diagnosis not present

## 2017-05-17 DIAGNOSIS — C169 Malignant neoplasm of stomach, unspecified: Secondary | ICD-10-CM | POA: Diagnosis not present

## 2017-05-17 DIAGNOSIS — M1990 Unspecified osteoarthritis, unspecified site: Secondary | ICD-10-CM | POA: Diagnosis not present

## 2017-05-17 DIAGNOSIS — I700 Atherosclerosis of aorta: Secondary | ICD-10-CM | POA: Diagnosis not present

## 2017-05-17 DIAGNOSIS — R278 Other lack of coordination: Secondary | ICD-10-CM | POA: Diagnosis not present

## 2017-05-17 DIAGNOSIS — R509 Fever, unspecified: Secondary | ICD-10-CM | POA: Diagnosis not present

## 2017-05-17 DIAGNOSIS — Z8507 Personal history of malignant neoplasm of pancreas: Secondary | ICD-10-CM | POA: Diagnosis not present

## 2017-05-17 DIAGNOSIS — M6281 Muscle weakness (generalized): Secondary | ICD-10-CM | POA: Diagnosis not present

## 2017-05-17 DIAGNOSIS — Z903 Acquired absence of stomach [part of]: Secondary | ICD-10-CM | POA: Diagnosis not present

## 2017-05-17 DIAGNOSIS — Z743 Need for continuous supervision: Secondary | ICD-10-CM | POA: Diagnosis not present

## 2017-05-17 DIAGNOSIS — Z7982 Long term (current) use of aspirin: Secondary | ICD-10-CM | POA: Diagnosis not present

## 2017-05-17 DIAGNOSIS — E861 Hypovolemia: Secondary | ICD-10-CM | POA: Diagnosis not present

## 2017-05-17 DIAGNOSIS — Z85028 Personal history of other malignant neoplasm of stomach: Secondary | ICD-10-CM | POA: Diagnosis not present

## 2017-05-17 DIAGNOSIS — R531 Weakness: Secondary | ICD-10-CM | POA: Diagnosis not present

## 2017-05-17 DIAGNOSIS — R2681 Unsteadiness on feet: Secondary | ICD-10-CM | POA: Diagnosis not present

## 2017-05-17 DIAGNOSIS — R2689 Other abnormalities of gait and mobility: Secondary | ICD-10-CM | POA: Diagnosis not present

## 2017-05-17 DIAGNOSIS — R188 Other ascites: Secondary | ICD-10-CM | POA: Diagnosis not present

## 2017-05-17 DIAGNOSIS — Z9221 Personal history of antineoplastic chemotherapy: Secondary | ICD-10-CM | POA: Diagnosis not present

## 2017-05-17 DIAGNOSIS — A419 Sepsis, unspecified organism: Secondary | ICD-10-CM | POA: Diagnosis not present

## 2017-05-17 DIAGNOSIS — M4856XA Collapsed vertebra, not elsewhere classified, lumbar region, initial encounter for fracture: Secondary | ICD-10-CM | POA: Diagnosis not present

## 2017-05-17 DIAGNOSIS — I110 Hypertensive heart disease with heart failure: Secondary | ICD-10-CM | POA: Diagnosis not present

## 2017-05-17 DIAGNOSIS — J189 Pneumonia, unspecified organism: Secondary | ICD-10-CM | POA: Diagnosis not present

## 2017-05-17 DIAGNOSIS — C253 Malignant neoplasm of pancreatic duct: Secondary | ICD-10-CM | POA: Diagnosis not present

## 2017-05-17 DIAGNOSIS — M8580 Other specified disorders of bone density and structure, unspecified site: Secondary | ICD-10-CM | POA: Diagnosis not present

## 2017-05-17 DIAGNOSIS — Z431 Encounter for attention to gastrostomy: Secondary | ICD-10-CM | POA: Diagnosis not present

## 2017-05-17 DIAGNOSIS — J181 Lobar pneumonia, unspecified organism: Secondary | ICD-10-CM | POA: Diagnosis not present

## 2017-05-17 DIAGNOSIS — R279 Unspecified lack of coordination: Secondary | ICD-10-CM | POA: Diagnosis not present

## 2017-05-17 DIAGNOSIS — Z85020 Personal history of malignant carcinoid tumor of stomach: Secondary | ICD-10-CM | POA: Diagnosis not present

## 2017-05-17 DIAGNOSIS — I509 Heart failure, unspecified: Secondary | ICD-10-CM | POA: Diagnosis not present

## 2017-05-17 DIAGNOSIS — I712 Thoracic aortic aneurysm, without rupture: Secondary | ICD-10-CM | POA: Diagnosis not present

## 2017-05-17 DIAGNOSIS — R109 Unspecified abdominal pain: Secondary | ICD-10-CM | POA: Diagnosis not present

## 2017-05-17 DIAGNOSIS — J9690 Respiratory failure, unspecified, unspecified whether with hypoxia or hypercapnia: Secondary | ICD-10-CM | POA: Diagnosis not present

## 2017-05-17 DIAGNOSIS — I252 Old myocardial infarction: Secondary | ICD-10-CM | POA: Diagnosis not present

## 2017-05-17 DIAGNOSIS — Z66 Do not resuscitate: Secondary | ICD-10-CM | POA: Diagnosis not present

## 2017-05-17 DIAGNOSIS — E1165 Type 2 diabetes mellitus with hyperglycemia: Secondary | ICD-10-CM | POA: Diagnosis not present

## 2017-05-17 DIAGNOSIS — Z951 Presence of aortocoronary bypass graft: Secondary | ICD-10-CM | POA: Diagnosis not present

## 2017-05-17 DIAGNOSIS — D649 Anemia, unspecified: Secondary | ICD-10-CM | POA: Diagnosis not present

## 2017-05-17 DIAGNOSIS — R918 Other nonspecific abnormal finding of lung field: Secondary | ICD-10-CM | POA: Diagnosis not present

## 2017-05-17 DIAGNOSIS — Z79899 Other long term (current) drug therapy: Secondary | ICD-10-CM | POA: Diagnosis not present

## 2017-05-17 DIAGNOSIS — Z888 Allergy status to other drugs, medicaments and biological substances status: Secondary | ICD-10-CM | POA: Diagnosis not present

## 2017-05-17 DIAGNOSIS — M109 Gout, unspecified: Secondary | ICD-10-CM | POA: Diagnosis not present

## 2017-05-17 DIAGNOSIS — N200 Calculus of kidney: Secondary | ICD-10-CM | POA: Diagnosis not present

## 2017-05-18 DIAGNOSIS — Z903 Acquired absence of stomach [part of]: Secondary | ICD-10-CM

## 2017-05-18 DIAGNOSIS — J181 Lobar pneumonia, unspecified organism: Secondary | ICD-10-CM

## 2017-05-18 DIAGNOSIS — R509 Fever, unspecified: Secondary | ICD-10-CM

## 2017-05-18 DIAGNOSIS — D472 Monoclonal gammopathy: Secondary | ICD-10-CM

## 2017-05-18 DIAGNOSIS — D72829 Elevated white blood cell count, unspecified: Secondary | ICD-10-CM

## 2017-05-18 DIAGNOSIS — C169 Malignant neoplasm of stomach, unspecified: Secondary | ICD-10-CM | POA: Diagnosis not present

## 2017-05-18 DIAGNOSIS — E860 Dehydration: Secondary | ICD-10-CM

## 2017-05-18 DIAGNOSIS — Z8507 Personal history of malignant neoplasm of pancreas: Secondary | ICD-10-CM

## 2017-05-18 DIAGNOSIS — Z85020 Personal history of malignant carcinoid tumor of stomach: Secondary | ICD-10-CM

## 2017-05-18 DIAGNOSIS — R188 Other ascites: Secondary | ICD-10-CM | POA: Diagnosis not present

## 2017-05-18 DIAGNOSIS — M8580 Other specified disorders of bone density and structure, unspecified site: Secondary | ICD-10-CM

## 2017-05-18 DIAGNOSIS — J189 Pneumonia, unspecified organism: Secondary | ICD-10-CM

## 2017-05-18 DIAGNOSIS — D649 Anemia, unspecified: Secondary | ICD-10-CM

## 2017-05-21 DIAGNOSIS — Z931 Gastrostomy status: Secondary | ICD-10-CM | POA: Diagnosis not present

## 2017-05-21 DIAGNOSIS — R509 Fever, unspecified: Secondary | ICD-10-CM | POA: Diagnosis not present

## 2017-05-21 DIAGNOSIS — Z8507 Personal history of malignant neoplasm of pancreas: Secondary | ICD-10-CM | POA: Diagnosis not present

## 2017-05-21 DIAGNOSIS — Z85028 Personal history of other malignant neoplasm of stomach: Secondary | ICD-10-CM | POA: Diagnosis not present

## 2017-05-21 DIAGNOSIS — J181 Lobar pneumonia, unspecified organism: Secondary | ICD-10-CM | POA: Diagnosis not present

## 2017-05-21 DIAGNOSIS — R188 Other ascites: Secondary | ICD-10-CM | POA: Diagnosis not present

## 2017-05-21 DIAGNOSIS — J189 Pneumonia, unspecified organism: Secondary | ICD-10-CM | POA: Diagnosis not present

## 2017-05-21 DIAGNOSIS — Z9221 Personal history of antineoplastic chemotherapy: Secondary | ICD-10-CM | POA: Diagnosis not present

## 2017-05-24 DIAGNOSIS — E119 Type 2 diabetes mellitus without complications: Secondary | ICD-10-CM | POA: Diagnosis not present

## 2017-05-24 DIAGNOSIS — T85528A Displacement of other gastrointestinal prosthetic devices, implants and grafts, initial encounter: Secondary | ICD-10-CM | POA: Diagnosis not present

## 2017-05-24 DIAGNOSIS — Z9221 Personal history of antineoplastic chemotherapy: Secondary | ICD-10-CM | POA: Diagnosis not present

## 2017-05-24 DIAGNOSIS — M6281 Muscle weakness (generalized): Secondary | ICD-10-CM | POA: Diagnosis not present

## 2017-05-24 DIAGNOSIS — Z931 Gastrostomy status: Secondary | ICD-10-CM | POA: Diagnosis not present

## 2017-05-24 DIAGNOSIS — I110 Hypertensive heart disease with heart failure: Secondary | ICD-10-CM | POA: Diagnosis not present

## 2017-05-24 DIAGNOSIS — Z8507 Personal history of malignant neoplasm of pancreas: Secondary | ICD-10-CM | POA: Diagnosis not present

## 2017-05-24 DIAGNOSIS — J181 Lobar pneumonia, unspecified organism: Secondary | ICD-10-CM | POA: Diagnosis not present

## 2017-05-24 DIAGNOSIS — R2689 Other abnormalities of gait and mobility: Secondary | ICD-10-CM | POA: Diagnosis not present

## 2017-05-24 DIAGNOSIS — R279 Unspecified lack of coordination: Secondary | ICD-10-CM | POA: Diagnosis not present

## 2017-05-24 DIAGNOSIS — Z4682 Encounter for fitting and adjustment of non-vascular catheter: Secondary | ICD-10-CM | POA: Diagnosis not present

## 2017-05-24 DIAGNOSIS — R1110 Vomiting, unspecified: Secondary | ICD-10-CM | POA: Diagnosis not present

## 2017-05-24 DIAGNOSIS — I2510 Atherosclerotic heart disease of native coronary artery without angina pectoris: Secondary | ICD-10-CM | POA: Diagnosis not present

## 2017-05-24 DIAGNOSIS — R799 Abnormal finding of blood chemistry, unspecified: Secondary | ICD-10-CM | POA: Diagnosis not present

## 2017-05-24 DIAGNOSIS — R278 Other lack of coordination: Secondary | ICD-10-CM | POA: Diagnosis not present

## 2017-05-24 DIAGNOSIS — Z951 Presence of aortocoronary bypass graft: Secondary | ICD-10-CM | POA: Diagnosis not present

## 2017-05-24 DIAGNOSIS — C253 Malignant neoplasm of pancreatic duct: Secondary | ICD-10-CM | POA: Diagnosis not present

## 2017-05-24 DIAGNOSIS — T50901A Poisoning by unspecified drugs, medicaments and biological substances, accidental (unintentional), initial encounter: Secondary | ICD-10-CM | POA: Diagnosis not present

## 2017-05-24 DIAGNOSIS — R2681 Unsteadiness on feet: Secondary | ICD-10-CM | POA: Diagnosis not present

## 2017-05-24 DIAGNOSIS — R112 Nausea with vomiting, unspecified: Secondary | ICD-10-CM | POA: Diagnosis not present

## 2017-05-24 DIAGNOSIS — R188 Other ascites: Secondary | ICD-10-CM | POA: Diagnosis not present

## 2017-05-24 DIAGNOSIS — N183 Chronic kidney disease, stage 3 (moderate): Secondary | ICD-10-CM | POA: Diagnosis not present

## 2017-05-24 DIAGNOSIS — Z743 Need for continuous supervision: Secondary | ICD-10-CM | POA: Diagnosis not present

## 2017-05-24 DIAGNOSIS — Z794 Long term (current) use of insulin: Secondary | ICD-10-CM | POA: Diagnosis not present

## 2017-05-24 DIAGNOSIS — Z7982 Long term (current) use of aspirin: Secondary | ICD-10-CM | POA: Diagnosis not present

## 2017-05-24 DIAGNOSIS — R1310 Dysphagia, unspecified: Secondary | ICD-10-CM | POA: Diagnosis not present

## 2017-05-24 DIAGNOSIS — E871 Hypo-osmolality and hyponatremia: Secondary | ICD-10-CM | POA: Diagnosis not present

## 2017-05-24 DIAGNOSIS — K859 Acute pancreatitis without necrosis or infection, unspecified: Secondary | ICD-10-CM | POA: Diagnosis not present

## 2017-05-24 DIAGNOSIS — E1165 Type 2 diabetes mellitus with hyperglycemia: Secondary | ICD-10-CM | POA: Diagnosis not present

## 2017-05-24 DIAGNOSIS — K222 Esophageal obstruction: Secondary | ICD-10-CM | POA: Diagnosis not present

## 2017-05-24 DIAGNOSIS — R509 Fever, unspecified: Secondary | ICD-10-CM | POA: Diagnosis not present

## 2017-05-24 DIAGNOSIS — I252 Old myocardial infarction: Secondary | ICD-10-CM | POA: Diagnosis not present

## 2017-05-24 DIAGNOSIS — Z431 Encounter for attention to gastrostomy: Secondary | ICD-10-CM | POA: Diagnosis not present

## 2017-05-24 DIAGNOSIS — J189 Pneumonia, unspecified organism: Secondary | ICD-10-CM | POA: Diagnosis not present

## 2017-05-24 DIAGNOSIS — D649 Anemia, unspecified: Secondary | ICD-10-CM | POA: Diagnosis not present

## 2017-05-24 DIAGNOSIS — R109 Unspecified abdominal pain: Secondary | ICD-10-CM | POA: Diagnosis not present

## 2017-05-24 DIAGNOSIS — Z85028 Personal history of other malignant neoplasm of stomach: Secondary | ICD-10-CM | POA: Diagnosis not present

## 2017-05-24 DIAGNOSIS — T849XXA Unspecified complication of internal orthopedic prosthetic device, implant and graft, initial encounter: Secondary | ICD-10-CM | POA: Diagnosis not present

## 2017-05-24 DIAGNOSIS — R5381 Other malaise: Secondary | ICD-10-CM | POA: Diagnosis not present

## 2017-05-24 DIAGNOSIS — R1312 Dysphagia, oropharyngeal phase: Secondary | ICD-10-CM | POA: Diagnosis not present

## 2017-05-24 DIAGNOSIS — I509 Heart failure, unspecified: Secondary | ICD-10-CM | POA: Diagnosis not present

## 2017-05-24 DIAGNOSIS — R633 Feeding difficulties: Secondary | ICD-10-CM | POA: Diagnosis not present

## 2017-05-24 DIAGNOSIS — Z79899 Other long term (current) drug therapy: Secondary | ICD-10-CM | POA: Diagnosis not present

## 2017-05-24 DIAGNOSIS — I130 Hypertensive heart and chronic kidney disease with heart failure and stage 1 through stage 4 chronic kidney disease, or unspecified chronic kidney disease: Secondary | ICD-10-CM | POA: Diagnosis not present

## 2017-05-24 DIAGNOSIS — C169 Malignant neoplasm of stomach, unspecified: Secondary | ICD-10-CM | POA: Diagnosis not present

## 2017-05-26 DIAGNOSIS — Z431 Encounter for attention to gastrostomy: Secondary | ICD-10-CM | POA: Diagnosis not present

## 2017-05-26 DIAGNOSIS — J189 Pneumonia, unspecified organism: Secondary | ICD-10-CM | POA: Diagnosis not present

## 2017-05-26 DIAGNOSIS — E1165 Type 2 diabetes mellitus with hyperglycemia: Secondary | ICD-10-CM | POA: Diagnosis not present

## 2017-05-26 DIAGNOSIS — I110 Hypertensive heart disease with heart failure: Secondary | ICD-10-CM | POA: Diagnosis not present

## 2017-05-26 DIAGNOSIS — R5381 Other malaise: Secondary | ICD-10-CM | POA: Diagnosis not present

## 2017-05-26 DIAGNOSIS — Z4682 Encounter for fitting and adjustment of non-vascular catheter: Secondary | ICD-10-CM | POA: Diagnosis not present

## 2017-05-28 DIAGNOSIS — T85528A Displacement of other gastrointestinal prosthetic devices, implants and grafts, initial encounter: Secondary | ICD-10-CM | POA: Diagnosis not present

## 2017-05-28 DIAGNOSIS — R1310 Dysphagia, unspecified: Secondary | ICD-10-CM | POA: Diagnosis not present

## 2017-05-30 DIAGNOSIS — K859 Acute pancreatitis without necrosis or infection, unspecified: Secondary | ICD-10-CM | POA: Diagnosis not present

## 2017-05-30 DIAGNOSIS — Z8507 Personal history of malignant neoplasm of pancreas: Secondary | ICD-10-CM | POA: Diagnosis not present

## 2017-05-30 DIAGNOSIS — E871 Hypo-osmolality and hyponatremia: Secondary | ICD-10-CM | POA: Diagnosis not present

## 2017-05-30 DIAGNOSIS — Z951 Presence of aortocoronary bypass graft: Secondary | ICD-10-CM | POA: Diagnosis not present

## 2017-05-30 DIAGNOSIS — I509 Heart failure, unspecified: Secondary | ICD-10-CM | POA: Diagnosis not present

## 2017-05-30 DIAGNOSIS — T50901A Poisoning by unspecified drugs, medicaments and biological substances, accidental (unintentional), initial encounter: Secondary | ICD-10-CM | POA: Diagnosis not present

## 2017-05-30 DIAGNOSIS — I252 Old myocardial infarction: Secondary | ICD-10-CM | POA: Diagnosis not present

## 2017-05-30 DIAGNOSIS — J189 Pneumonia, unspecified organism: Secondary | ICD-10-CM | POA: Diagnosis not present

## 2017-05-30 DIAGNOSIS — Z79899 Other long term (current) drug therapy: Secondary | ICD-10-CM | POA: Diagnosis not present

## 2017-05-30 DIAGNOSIS — Z794 Long term (current) use of insulin: Secondary | ICD-10-CM | POA: Diagnosis not present

## 2017-05-30 DIAGNOSIS — I130 Hypertensive heart and chronic kidney disease with heart failure and stage 1 through stage 4 chronic kidney disease, or unspecified chronic kidney disease: Secondary | ICD-10-CM | POA: Diagnosis not present

## 2017-05-30 DIAGNOSIS — Z7982 Long term (current) use of aspirin: Secondary | ICD-10-CM | POA: Diagnosis not present

## 2017-05-30 DIAGNOSIS — R109 Unspecified abdominal pain: Secondary | ICD-10-CM | POA: Diagnosis not present

## 2017-05-30 DIAGNOSIS — N183 Chronic kidney disease, stage 3 (moderate): Secondary | ICD-10-CM | POA: Diagnosis not present

## 2017-05-30 DIAGNOSIS — I2510 Atherosclerotic heart disease of native coronary artery without angina pectoris: Secondary | ICD-10-CM | POA: Diagnosis not present

## 2017-05-30 DIAGNOSIS — R112 Nausea with vomiting, unspecified: Secondary | ICD-10-CM | POA: Diagnosis not present

## 2017-05-31 DIAGNOSIS — K5720 Diverticulitis of large intestine with perforation and abscess without bleeding: Secondary | ICD-10-CM | POA: Diagnosis not present

## 2017-05-31 DIAGNOSIS — I2510 Atherosclerotic heart disease of native coronary artery without angina pectoris: Secondary | ICD-10-CM | POA: Diagnosis not present

## 2017-05-31 DIAGNOSIS — I509 Heart failure, unspecified: Secondary | ICD-10-CM | POA: Diagnosis not present

## 2017-05-31 DIAGNOSIS — R1310 Dysphagia, unspecified: Secondary | ICD-10-CM | POA: Diagnosis not present

## 2017-05-31 DIAGNOSIS — E1122 Type 2 diabetes mellitus with diabetic chronic kidney disease: Secondary | ICD-10-CM | POA: Diagnosis not present

## 2017-05-31 DIAGNOSIS — R52 Pain, unspecified: Secondary | ICD-10-CM | POA: Diagnosis not present

## 2017-05-31 DIAGNOSIS — R110 Nausea: Secondary | ICD-10-CM | POA: Diagnosis not present

## 2017-05-31 DIAGNOSIS — I454 Nonspecific intraventricular block: Secondary | ICD-10-CM | POA: Diagnosis not present

## 2017-05-31 DIAGNOSIS — I130 Hypertensive heart and chronic kidney disease with heart failure and stage 1 through stage 4 chronic kidney disease, or unspecified chronic kidney disease: Secondary | ICD-10-CM | POA: Diagnosis not present

## 2017-05-31 DIAGNOSIS — M1000 Idiopathic gout, unspecified site: Secondary | ICD-10-CM | POA: Diagnosis not present

## 2017-05-31 DIAGNOSIS — E871 Hypo-osmolality and hyponatremia: Secondary | ICD-10-CM | POA: Diagnosis not present

## 2017-05-31 DIAGNOSIS — I714 Abdominal aortic aneurysm, without rupture: Secondary | ICD-10-CM | POA: Diagnosis not present

## 2017-05-31 DIAGNOSIS — I10 Essential (primary) hypertension: Secondary | ICD-10-CM | POA: Diagnosis not present

## 2017-05-31 DIAGNOSIS — Z951 Presence of aortocoronary bypass graft: Secondary | ICD-10-CM | POA: Diagnosis not present

## 2017-05-31 DIAGNOSIS — I700 Atherosclerosis of aorta: Secondary | ICD-10-CM | POA: Diagnosis not present

## 2017-05-31 DIAGNOSIS — E785 Hyperlipidemia, unspecified: Secondary | ICD-10-CM | POA: Diagnosis not present

## 2017-05-31 DIAGNOSIS — R1084 Generalized abdominal pain: Secondary | ICD-10-CM | POA: Diagnosis not present

## 2017-05-31 DIAGNOSIS — B370 Candidal stomatitis: Secondary | ICD-10-CM | POA: Diagnosis not present

## 2017-05-31 DIAGNOSIS — K5900 Constipation, unspecified: Secondary | ICD-10-CM | POA: Diagnosis not present

## 2017-05-31 DIAGNOSIS — R1110 Vomiting, unspecified: Secondary | ICD-10-CM | POA: Diagnosis not present

## 2017-05-31 DIAGNOSIS — Z515 Encounter for palliative care: Secondary | ICD-10-CM | POA: Diagnosis not present

## 2017-05-31 DIAGNOSIS — M6281 Muscle weakness (generalized): Secondary | ICD-10-CM | POA: Diagnosis not present

## 2017-05-31 DIAGNOSIS — Z79899 Other long term (current) drug therapy: Secondary | ICD-10-CM | POA: Diagnosis not present

## 2017-05-31 DIAGNOSIS — R918 Other nonspecific abnormal finding of lung field: Secondary | ICD-10-CM | POA: Diagnosis not present

## 2017-05-31 DIAGNOSIS — C7889 Secondary malignant neoplasm of other digestive organs: Secondary | ICD-10-CM | POA: Diagnosis not present

## 2017-05-31 DIAGNOSIS — Z7982 Long term (current) use of aspirin: Secondary | ICD-10-CM | POA: Diagnosis not present

## 2017-05-31 DIAGNOSIS — C169 Malignant neoplasm of stomach, unspecified: Secondary | ICD-10-CM | POA: Diagnosis not present

## 2017-05-31 DIAGNOSIS — K859 Acute pancreatitis without necrosis or infection, unspecified: Secondary | ICD-10-CM | POA: Diagnosis not present

## 2017-05-31 DIAGNOSIS — R933 Abnormal findings on diagnostic imaging of other parts of digestive tract: Secondary | ICD-10-CM | POA: Diagnosis not present

## 2017-05-31 DIAGNOSIS — K869 Disease of pancreas, unspecified: Secondary | ICD-10-CM | POA: Diagnosis not present

## 2017-05-31 DIAGNOSIS — Z452 Encounter for adjustment and management of vascular access device: Secondary | ICD-10-CM | POA: Diagnosis not present

## 2017-05-31 DIAGNOSIS — Z931 Gastrostomy status: Secondary | ICD-10-CM | POA: Diagnosis not present

## 2017-05-31 DIAGNOSIS — R188 Other ascites: Secondary | ICD-10-CM | POA: Diagnosis not present

## 2017-05-31 DIAGNOSIS — I129 Hypertensive chronic kidney disease with stage 1 through stage 4 chronic kidney disease, or unspecified chronic kidney disease: Secondary | ICD-10-CM | POA: Diagnosis not present

## 2017-05-31 DIAGNOSIS — Z85028 Personal history of other malignant neoplasm of stomach: Secondary | ICD-10-CM | POA: Diagnosis not present

## 2017-05-31 DIAGNOSIS — Z66 Do not resuscitate: Secondary | ICD-10-CM | POA: Diagnosis not present

## 2017-05-31 DIAGNOSIS — Z980 Intestinal bypass and anastomosis status: Secondary | ICD-10-CM | POA: Diagnosis not present

## 2017-05-31 DIAGNOSIS — Z8507 Personal history of malignant neoplasm of pancreas: Secondary | ICD-10-CM | POA: Diagnosis not present

## 2017-05-31 DIAGNOSIS — J9811 Atelectasis: Secondary | ICD-10-CM | POA: Diagnosis not present

## 2017-05-31 DIAGNOSIS — Z9049 Acquired absence of other specified parts of digestive tract: Secondary | ICD-10-CM | POA: Diagnosis not present

## 2017-05-31 DIAGNOSIS — R180 Malignant ascites: Secondary | ICD-10-CM | POA: Diagnosis not present

## 2017-05-31 DIAGNOSIS — Z4682 Encounter for fitting and adjustment of non-vascular catheter: Secondary | ICD-10-CM | POA: Diagnosis not present

## 2017-05-31 DIAGNOSIS — N183 Chronic kidney disease, stage 3 (moderate): Secondary | ICD-10-CM | POA: Diagnosis not present

## 2017-05-31 DIAGNOSIS — R5381 Other malaise: Secondary | ICD-10-CM | POA: Diagnosis not present

## 2017-05-31 DIAGNOSIS — R279 Unspecified lack of coordination: Secondary | ICD-10-CM | POA: Diagnosis not present

## 2017-05-31 DIAGNOSIS — I110 Hypertensive heart disease with heart failure: Secondary | ICD-10-CM | POA: Diagnosis not present

## 2017-05-31 DIAGNOSIS — R112 Nausea with vomiting, unspecified: Secondary | ICD-10-CM | POA: Diagnosis not present

## 2017-05-31 DIAGNOSIS — I252 Old myocardial infarction: Secondary | ICD-10-CM | POA: Diagnosis not present

## 2017-05-31 DIAGNOSIS — J90 Pleural effusion, not elsewhere classified: Secondary | ICD-10-CM | POA: Diagnosis not present

## 2017-05-31 DIAGNOSIS — C161 Malignant neoplasm of fundus of stomach: Secondary | ICD-10-CM | POA: Diagnosis not present

## 2017-05-31 DIAGNOSIS — Z794 Long term (current) use of insulin: Secondary | ICD-10-CM | POA: Diagnosis not present

## 2017-05-31 DIAGNOSIS — J910 Malignant pleural effusion: Secondary | ICD-10-CM | POA: Diagnosis not present

## 2017-05-31 DIAGNOSIS — I440 Atrioventricular block, first degree: Secondary | ICD-10-CM | POA: Diagnosis not present

## 2017-05-31 DIAGNOSIS — K9429 Other complications of gastrostomy: Secondary | ICD-10-CM | POA: Diagnosis not present

## 2017-06-08 DEATH — deceased
